# Patient Record
Sex: Male | Born: 1945 | Race: White | Hispanic: No
Health system: Southern US, Community
[De-identification: ages and names within clinical notes are randomized; demographics above are authoritative.]

## PROBLEM LIST (undated history)

## (undated) DIAGNOSIS — M1990 Unspecified osteoarthritis, unspecified site: Secondary | ICD-10-CM

## (undated) DIAGNOSIS — M545 Low back pain, unspecified: Secondary | ICD-10-CM

## (undated) DIAGNOSIS — F329 Major depressive disorder, single episode, unspecified: Secondary | ICD-10-CM

## (undated) DIAGNOSIS — E785 Hyperlipidemia, unspecified: Secondary | ICD-10-CM

## (undated) DIAGNOSIS — T4145XA Adverse effect of unspecified anesthetic, initial encounter: Secondary | ICD-10-CM

## (undated) DIAGNOSIS — I4891 Unspecified atrial fibrillation: Secondary | ICD-10-CM

## (undated) DIAGNOSIS — J449 Chronic obstructive pulmonary disease, unspecified: Secondary | ICD-10-CM

## (undated) DIAGNOSIS — J189 Pneumonia, unspecified organism: Secondary | ICD-10-CM

## (undated) DIAGNOSIS — F419 Anxiety disorder, unspecified: Secondary | ICD-10-CM

## (undated) DIAGNOSIS — Z8669 Personal history of other diseases of the nervous system and sense organs: Secondary | ICD-10-CM

## (undated) DIAGNOSIS — F0390 Unspecified dementia without behavioral disturbance: Secondary | ICD-10-CM

## (undated) DIAGNOSIS — I10 Essential (primary) hypertension: Secondary | ICD-10-CM

## (undated) DIAGNOSIS — F32A Depression, unspecified: Secondary | ICD-10-CM

## (undated) DIAGNOSIS — T8859XA Other complications of anesthesia, initial encounter: Secondary | ICD-10-CM

## (undated) HISTORY — DX: Personal history of other diseases of the nervous system and sense organs: Z86.69

## (undated) HISTORY — PX: CARDIAC CATHETERIZATION: SHX172

## (undated) HISTORY — DX: Essential (primary) hypertension: I10

## (undated) HISTORY — DX: Unspecified atrial fibrillation: I48.91

## (undated) HISTORY — PX: BRAIN SURGERY: SHX531

## (undated) HISTORY — DX: Low back pain: M54.5

## (undated) HISTORY — DX: Unspecified dementia, unspecified severity, without behavioral disturbance, psychotic disturbance, mood disturbance, and anxiety: F03.90

## (undated) HISTORY — PX: JOINT REPLACEMENT: SHX530

## (undated) HISTORY — DX: Unspecified osteoarthritis, unspecified site: M19.90

## (undated) HISTORY — DX: Depression, unspecified: F32.A

## (undated) HISTORY — DX: Anxiety disorder, unspecified: F41.9

## (undated) HISTORY — DX: Low back pain, unspecified: M54.50

## (undated) HISTORY — DX: Hyperlipidemia, unspecified: E78.5

## (undated) HISTORY — PX: NASAL RECONSTRUCTION: SHX2069

## (undated) HISTORY — PX: KIDNEY STONE SURGERY: SHX686

## (undated) HISTORY — DX: Major depressive disorder, single episode, unspecified: F32.9

## (undated) HISTORY — PX: ARM WOUND REPAIR / CLOSURE: SUR1141

## (undated) HISTORY — PX: LEG WOUND REPAIR / CLOSURE: SUR1143

---
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
## 1976-04-01 HISTORY — PX: EYE SURGERY: SHX253

## 2000-04-01 HISTORY — PX: COLONOSCOPY: SHX174

## 2000-04-01 HISTORY — PX: OTHER SURGICAL HISTORY: SHX169

## 2000-05-05 ENCOUNTER — Inpatient Hospital Stay (HOSPITAL_COMMUNITY)
Admission: RE | Admit: 2000-05-05 | Discharge: 2000-05-26 | Payer: Self-pay | Admitting: Physical Medicine & Rehabilitation

## 2000-05-06 ENCOUNTER — Encounter: Payer: Self-pay | Admitting: Physical Medicine & Rehabilitation

## 2000-07-08 ENCOUNTER — Encounter
Admission: RE | Admit: 2000-07-08 | Discharge: 2000-08-29 | Payer: Self-pay | Admitting: Physical Medicine & Rehabilitation

## 2000-10-16 ENCOUNTER — Encounter: Admission: RE | Admit: 2000-10-16 | Discharge: 2000-11-27 | Payer: Self-pay

## 2000-10-25 ENCOUNTER — Emergency Department (HOSPITAL_COMMUNITY): Admission: EM | Admit: 2000-10-25 | Discharge: 2000-10-25 | Payer: Self-pay | Admitting: Emergency Medicine

## 2001-02-01 ENCOUNTER — Encounter: Payer: Self-pay | Admitting: *Deleted

## 2001-02-01 ENCOUNTER — Emergency Department (HOSPITAL_COMMUNITY): Admission: EM | Admit: 2001-02-01 | Discharge: 2001-02-01 | Payer: Self-pay | Admitting: *Deleted

## 2001-07-29 ENCOUNTER — Emergency Department (HOSPITAL_COMMUNITY): Admission: EM | Admit: 2001-07-29 | Discharge: 2001-07-29 | Payer: Self-pay | Admitting: *Deleted

## 2001-07-29 ENCOUNTER — Encounter: Payer: Self-pay | Admitting: *Deleted

## 2003-02-15 ENCOUNTER — Emergency Department (HOSPITAL_COMMUNITY): Admission: EM | Admit: 2003-02-15 | Discharge: 2003-02-15 | Payer: Self-pay | Admitting: Emergency Medicine

## 2006-08-03 ENCOUNTER — Emergency Department (HOSPITAL_COMMUNITY): Admission: EM | Admit: 2006-08-03 | Discharge: 2006-08-03 | Payer: Self-pay | Admitting: Emergency Medicine

## 2007-06-30 ENCOUNTER — Encounter: Payer: Self-pay | Admitting: Physician Assistant

## 2007-08-23 ENCOUNTER — Emergency Department (HOSPITAL_COMMUNITY): Admission: EM | Admit: 2007-08-23 | Discharge: 2007-08-23 | Payer: Self-pay | Admitting: Emergency Medicine

## 2007-08-31 ENCOUNTER — Encounter: Payer: Self-pay | Admitting: Physician Assistant

## 2007-09-09 ENCOUNTER — Encounter (INDEPENDENT_AMBULATORY_CARE_PROVIDER_SITE_OTHER): Payer: Self-pay | Admitting: Family Medicine

## 2007-10-05 ENCOUNTER — Ambulatory Visit: Payer: Self-pay | Admitting: Family Medicine

## 2007-10-05 DIAGNOSIS — Z87898 Personal history of other specified conditions: Secondary | ICD-10-CM

## 2007-10-05 DIAGNOSIS — F329 Major depressive disorder, single episode, unspecified: Secondary | ICD-10-CM | POA: Insufficient documentation

## 2007-10-05 DIAGNOSIS — M545 Low back pain: Secondary | ICD-10-CM | POA: Insufficient documentation

## 2007-10-05 DIAGNOSIS — F3289 Other specified depressive episodes: Secondary | ICD-10-CM | POA: Insufficient documentation

## 2007-10-05 DIAGNOSIS — J309 Allergic rhinitis, unspecified: Secondary | ICD-10-CM | POA: Insufficient documentation

## 2007-10-05 DIAGNOSIS — M129 Arthropathy, unspecified: Secondary | ICD-10-CM | POA: Insufficient documentation

## 2007-10-05 DIAGNOSIS — Z8679 Personal history of other diseases of the circulatory system: Secondary | ICD-10-CM

## 2007-10-05 DIAGNOSIS — F17200 Nicotine dependence, unspecified, uncomplicated: Secondary | ICD-10-CM

## 2007-10-05 DIAGNOSIS — F411 Generalized anxiety disorder: Secondary | ICD-10-CM | POA: Insufficient documentation

## 2007-10-06 ENCOUNTER — Encounter (INDEPENDENT_AMBULATORY_CARE_PROVIDER_SITE_OTHER): Payer: Self-pay | Admitting: Family Medicine

## 2007-10-06 LAB — CONVERTED CEMR LAB
AST: 12 units/L (ref 0–37)
Alkaline Phosphatase: 115 units/L (ref 39–117)
BUN: 13 mg/dL (ref 6–23)
Basophils Absolute: 0.1 10*3/uL (ref 0.0–0.1)
Calcium: 9.3 mg/dL (ref 8.4–10.5)
Chloride: 108 meq/L (ref 96–112)
Creatinine, Ser: 0.76 mg/dL (ref 0.40–1.50)
Glucose, Bld: 76 mg/dL (ref 70–99)
Hemoglobin: 16.5 g/dL (ref 13.0–17.0)
Monocytes Absolute: 0.7 10*3/uL (ref 0.1–1.0)
Neutro Abs: 4 10*3/uL (ref 1.7–7.7)
Neutrophils Relative %: 59 % (ref 43–77)
Potassium: 4.3 meq/L (ref 3.5–5.3)
RBC: 5.53 M/uL (ref 4.22–5.81)
RDW: 13.2 % (ref 11.5–15.5)
Sodium: 141 meq/L (ref 135–145)
TSH: 1.17 microintl units/mL (ref 0.350–4.50)
WBC: 6.8 10*3/uL (ref 4.0–10.5)

## 2007-10-15 ENCOUNTER — Encounter (INDEPENDENT_AMBULATORY_CARE_PROVIDER_SITE_OTHER): Payer: Self-pay | Admitting: Family Medicine

## 2007-10-19 ENCOUNTER — Ambulatory Visit: Payer: Self-pay | Admitting: Family Medicine

## 2007-10-19 DIAGNOSIS — IMO0002 Reserved for concepts with insufficient information to code with codable children: Secondary | ICD-10-CM | POA: Insufficient documentation

## 2007-10-26 ENCOUNTER — Other Ambulatory Visit: Payer: Self-pay | Admitting: Emergency Medicine

## 2007-10-26 ENCOUNTER — Telehealth (INDEPENDENT_AMBULATORY_CARE_PROVIDER_SITE_OTHER): Payer: Self-pay | Admitting: Family Medicine

## 2007-10-27 ENCOUNTER — Ambulatory Visit: Payer: Self-pay | Admitting: Psychiatry

## 2007-10-27 ENCOUNTER — Inpatient Hospital Stay (HOSPITAL_COMMUNITY): Admission: AD | Admit: 2007-10-27 | Discharge: 2007-10-31 | Payer: Self-pay | Admitting: Psychiatry

## 2007-11-03 ENCOUNTER — Emergency Department (HOSPITAL_COMMUNITY): Admission: EM | Admit: 2007-11-03 | Discharge: 2007-11-03 | Payer: Self-pay | Admitting: Emergency Medicine

## 2007-11-19 ENCOUNTER — Ambulatory Visit: Payer: Self-pay | Admitting: Family Medicine

## 2007-11-24 ENCOUNTER — Encounter (INDEPENDENT_AMBULATORY_CARE_PROVIDER_SITE_OTHER): Payer: Self-pay | Admitting: Family Medicine

## 2007-12-31 ENCOUNTER — Ambulatory Visit: Payer: Self-pay | Admitting: Family Medicine

## 2007-12-31 ENCOUNTER — Ambulatory Visit (HOSPITAL_COMMUNITY): Admission: RE | Admit: 2007-12-31 | Discharge: 2007-12-31 | Payer: Self-pay | Admitting: Family Medicine

## 2007-12-31 DIAGNOSIS — S93409A Sprain of unspecified ligament of unspecified ankle, initial encounter: Secondary | ICD-10-CM | POA: Insufficient documentation

## 2008-01-04 ENCOUNTER — Ambulatory Visit: Payer: Self-pay | Admitting: Family Medicine

## 2008-01-18 ENCOUNTER — Ambulatory Visit: Payer: Self-pay | Admitting: Family Medicine

## 2008-02-09 ENCOUNTER — Ambulatory Visit (HOSPITAL_COMMUNITY): Payer: Self-pay | Admitting: Psychiatry

## 2008-02-12 ENCOUNTER — Ambulatory Visit (HOSPITAL_COMMUNITY): Payer: Self-pay | Admitting: Psychology

## 2008-02-15 ENCOUNTER — Ambulatory Visit: Payer: Self-pay | Admitting: Family Medicine

## 2008-02-15 DIAGNOSIS — R609 Edema, unspecified: Secondary | ICD-10-CM

## 2008-02-15 LAB — CONVERTED CEMR LAB
Cholesterol, target level: 200 mg/dL
HDL goal, serum: 40 mg/dL

## 2008-02-24 ENCOUNTER — Ambulatory Visit (HOSPITAL_COMMUNITY): Payer: Self-pay | Admitting: Psychology

## 2008-10-24 ENCOUNTER — Emergency Department (HOSPITAL_COMMUNITY): Admission: EM | Admit: 2008-10-24 | Discharge: 2008-10-24 | Payer: Self-pay | Admitting: Emergency Medicine

## 2008-10-24 ENCOUNTER — Ambulatory Visit (HOSPITAL_COMMUNITY): Admission: RE | Admit: 2008-10-24 | Discharge: 2008-10-24 | Payer: Self-pay | Admitting: Emergency Medicine

## 2009-09-14 ENCOUNTER — Emergency Department (HOSPITAL_COMMUNITY): Admission: EM | Admit: 2009-09-14 | Discharge: 2009-09-14 | Payer: Self-pay | Admitting: Emergency Medicine

## 2009-09-15 ENCOUNTER — Ambulatory Visit: Payer: Self-pay | Admitting: Family Medicine

## 2009-09-15 ENCOUNTER — Encounter: Payer: Self-pay | Admitting: Physician Assistant

## 2009-09-15 DIAGNOSIS — I10 Essential (primary) hypertension: Secondary | ICD-10-CM | POA: Insufficient documentation

## 2009-09-15 DIAGNOSIS — G8921 Chronic pain due to trauma: Secondary | ICD-10-CM | POA: Insufficient documentation

## 2009-09-15 DIAGNOSIS — E559 Vitamin D deficiency, unspecified: Secondary | ICD-10-CM | POA: Insufficient documentation

## 2009-09-18 ENCOUNTER — Telehealth: Payer: Self-pay | Admitting: Physician Assistant

## 2009-09-19 LAB — CONVERTED CEMR LAB
ALT: 26 units/L (ref 0–53)
AST: 21 units/L (ref 0–37)
Albumin: 4.3 g/dL (ref 3.5–5.2)
Alkaline Phosphatase: 74 units/L (ref 39–117)
BUN: 12 mg/dL (ref 6–23)
CO2: 21 meq/L (ref 19–32)
Calcium: 9.2 mg/dL (ref 8.4–10.5)
Chloride: 106 meq/L (ref 96–112)
Cholesterol: 229 mg/dL — ABNORMAL HIGH (ref 0–200)
Creatinine, Ser: 0.83 mg/dL (ref 0.40–1.50)
Glucose, Bld: 91 mg/dL (ref 70–99)
HCT: 49.4 % (ref 39.0–52.0)
HDL: 80 mg/dL (ref >39)
Hemoglobin: 16.9 g/dL (ref 13.0–17.0)
LDL Cholesterol: 132 mg/dL — ABNORMAL HIGH (ref 0–99)
MCHC: 34.2 g/dL (ref 30.0–36.0)
MCV: 97.1 fL (ref 78.0–100.0)
PSA: 1.64 ng/mL (ref 0.10–4.00)
Platelets: 252 10*3/uL (ref 150–400)
Potassium: 4 meq/L (ref 3.5–5.3)
RBC: 5.09 M/uL (ref 4.22–5.81)
RDW: 13.4 % (ref 11.5–15.5)
Sodium: 141 meq/L (ref 135–145)
TSH: 1.477 microintl units/mL (ref 0.350–4.500)
Total Bilirubin: 0.7 mg/dL (ref 0.3–1.2)
Total CHOL/HDL Ratio: 2.9
Total Protein: 6.8 g/dL (ref 6.0–8.3)
Triglycerides: 84 mg/dL (ref <150)
VLDL: 17 mg/dL (ref 0–40)
Vit D, 25-Hydroxy: 14 ng/mL — ABNORMAL LOW (ref 30–89)
WBC: 6 10*3/uL (ref 4.0–10.5)

## 2009-12-27 ENCOUNTER — Ambulatory Visit: Payer: Self-pay | Admitting: Family Medicine

## 2009-12-27 DIAGNOSIS — E785 Hyperlipidemia, unspecified: Secondary | ICD-10-CM | POA: Insufficient documentation

## 2010-01-03 LAB — CONVERTED CEMR LAB
ALT: 29 units/L (ref 0–53)
Albumin: 4.5 g/dL (ref 3.5–5.2)
Calcium: 9.5 mg/dL (ref 8.4–10.5)
Chloride: 104 meq/L (ref 96–112)
Cholesterol: 246 mg/dL — ABNORMAL HIGH (ref 0–200)
Creatinine, Ser: 0.92 mg/dL (ref 0.40–1.50)
HDL: 77 mg/dL (ref >39)
Potassium: 4.9 meq/L (ref 3.5–5.3)
Sodium: 140 meq/L (ref 135–145)
Total CHOL/HDL Ratio: 3.2
Triglycerides: 67 mg/dL (ref <150)
VLDL: 13 mg/dL (ref 0–40)

## 2010-01-10 ENCOUNTER — Ambulatory Visit: Payer: Self-pay | Admitting: Family Medicine

## 2010-02-13 ENCOUNTER — Ambulatory Visit: Payer: Self-pay | Admitting: Family Medicine

## 2010-02-13 DIAGNOSIS — J029 Acute pharyngitis, unspecified: Secondary | ICD-10-CM | POA: Insufficient documentation

## 2010-02-13 DIAGNOSIS — J209 Acute bronchitis, unspecified: Secondary | ICD-10-CM | POA: Insufficient documentation

## 2010-02-13 DIAGNOSIS — J0100 Acute maxillary sinusitis, unspecified: Secondary | ICD-10-CM

## 2010-03-18 ENCOUNTER — Emergency Department (HOSPITAL_COMMUNITY)
Admission: EM | Admit: 2010-03-18 | Discharge: 2010-03-18 | Payer: Self-pay | Source: Home / Self Care | Admitting: Emergency Medicine

## 2010-03-19 ENCOUNTER — Inpatient Hospital Stay (HOSPITAL_COMMUNITY): Admission: EM | Admit: 2010-03-19 | Discharge: 2010-03-21 | Payer: Self-pay | Source: Home / Self Care

## 2010-03-20 ENCOUNTER — Encounter: Payer: Self-pay | Admitting: Orthopedic Surgery

## 2010-03-22 ENCOUNTER — Telehealth: Payer: Self-pay | Admitting: Family Medicine

## 2010-03-26 ENCOUNTER — Encounter: Payer: Self-pay | Admitting: Family Medicine

## 2010-03-29 ENCOUNTER — Telehealth: Payer: Self-pay | Admitting: Family Medicine

## 2010-03-29 ENCOUNTER — Ambulatory Visit: Payer: Self-pay | Admitting: Family Medicine

## 2010-03-30 DIAGNOSIS — L0390 Cellulitis, unspecified: Secondary | ICD-10-CM

## 2010-03-30 DIAGNOSIS — L0291 Cutaneous abscess, unspecified: Secondary | ICD-10-CM

## 2010-04-03 ENCOUNTER — Encounter: Payer: Self-pay | Admitting: Family Medicine

## 2010-04-12 ENCOUNTER — Ambulatory Visit: Admit: 2010-04-12 | Payer: Self-pay | Admitting: Family Medicine

## 2010-04-16 ENCOUNTER — Encounter: Payer: Self-pay | Admitting: Family Medicine

## 2010-04-20 ENCOUNTER — Ambulatory Visit
Admission: RE | Admit: 2010-04-20 | Discharge: 2010-04-20 | Payer: Self-pay | Source: Home / Self Care | Attending: Family Medicine | Admitting: Family Medicine

## 2010-04-20 ENCOUNTER — Encounter: Payer: Self-pay | Admitting: Family Medicine
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
## 2010-05-01 NOTE — Progress Notes (Signed)
[facility]   Imported By: Lind Guest 09/20/2009 11:44:35  _____________________________________________________________________  External Attachment:    Type:   Image     Comment:   External Document

## 2010-05-01 NOTE — Assessment & Plan Note (Signed)
Summary: not well- room 2   Vital Signs:  Patient profile:   65 year old male Height:      68 inches Weight:      164 pounds BMI:     25.03 O2 Sat:      96 % on Room air Pulse rate:   90 / minute Resp:     16 per minute BP sitting:   160 / 80  (left arm)  Vitals Entered By: Everitt Amber LPN (December 27, 2009 9:30 AM)  Serial Vital Signs/Assessments:  Time      Position  BP       Pulse  Resp  Temp     By                     162/80                         Esperanza Sheets PA  CC: not feeling well Is Patient Diabetic? No Pain Assessment Patient in pain? no      Comments did not bring meds to ov   Primary Daylene Vandenbosch:  Franchot Heidelberg, MD  CC:  not feeling well.  History of Present Illness: Hx of TBI.  Has had problems with with depression intermittently.  Tried Lexapro in the past but made him irritable.  Also used Ritalin for awhile to improve thought processes.  "I have bad days."  Pt states that he has been feeling sad for approx 1 - 1 1/2 weeks now.  The rainy cold weather seems to be affecting his mood and also causing increased body aches.  He denies SI or HI.    Hx of htn. Taking medications as prescribed and denies side effects.  No headache, chest pain or palpitations.   Allergies (verified): 1)  ! * Sulfa  Past History:  Past medical history reviewed for relevance to current acute and chronic problems.  Past Medical History: Reviewed history from 09/15/2009 and no changes required. 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 Resp:  Denies cough and shortness of breath. GI:  Denies abdominal pain, change in bowel habits, loss of appetite, nausea, and  vomiting. Psych:  Complains of depression and easily tearful; denies anxiety, easily angered, sense of great danger, suicidal thoughts/plans, thoughts of violence, and thoughts /plans of harming others.  Physical Exam  General:  Well-developed,well-nourished,in no acute distress; alert,appropriate and cooperative throughout examination Head:  Normocephalic and atraumatic without obvious abnormalities. No apparent alopecia or balding. Ears:  External ear exam shows no significant lesions or deformities.  Otoscopic examination reveals clear canals, tympanic membranes are intact bilaterally without bulging, retraction, inflammation or discharge. Hearing is grossly normal bilaterally. Nose:  External nasal examination shows no deformity or inflammation. Nasal mucosa are pink and moist without lesions or exudates. Mouth:  Oral mucosa and oropharynx without lesions or exudates. Neck:  No deformities, masses, or tenderness noted. Lungs:  Normal respiratory effort, chest expands symmetrically. Lungs are clear to auscultation, no crackles or wheezes. Heart:  Normal rate and regular rhythm. S1 and S2 normal without gallop, murmur, click, rub or other extra sounds. Cervical Nodes:  No lymphadenopathy noted Psych:  Cognition and judgment appear intact. Alert and cooperative  with normal attention span and concentration. No apparent delusions, illusions, hallucinations   Impression & Recommendations:  Problem # 1:  ESSENTIAL HYPERTENSION (ICD-401.9)  His updated medication list for this problem includes:    Lisinopril 20 Mg Tabs (Lisinopril) ..... One tab by mouth once daily    Furosemide 20 Mg Tabs (Furosemide) .Marland Kitchen... Take 1/2 to 1 tablet in the morning as needed for swelling  Orders: T-Comprehensive Metabolic Panel ([id number])  BP today: 160/80 Prior BP: 140/80 (09/15/2009)  Prior 10 Yr Risk Heart Disease: Not enough information (02/15/2008)  Labs Reviewed: K+: 4.0 (09/15/2009) Creat: : 0.83  (09/15/2009)   Chol: 229 (09/15/2009)   HDL: 80 (09/15/2009)   LDL: 132 (09/15/2009)   TG: 84 (09/15/2009)  Problem # 2:  DEPRESSION (ICD-311) Assessment: Deteriorated Pt states "I feel better just talking to someone."  Declines referral to psychologist or prescription antidepressant.  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 One tab by mouth once daily 2)  Ibuprofen 800 Mg Tabs (Ibuprofen) .... One tab by mouth three times a day as needed 3)  Tramadol Hcl 50 Mg Tabs (Tramadol hcl) .... One tab as needed 4)  Furosemide 20 Mg Tabs (Furosemide) .... Take 1/2 to 1 tablet in the morning as needed for swelling 5)  Vitamin D (ergocalciferol) 50000 Unit Caps (Ergocalciferol) .... One cap by mouth every week  Other Orders: T-Lipid Profile ([id number]) Influenza Vaccine NON MCR ([id number])  Patient Instructions: 1)  Please schedule a follow-up appointment in 2 weeks. 2)  I have ordered blood work. You may have this drawn today. 3)  If your mood does not improve you may need to return to a psychologist. 4)  Tobacco is very bad for your health and your loved ones! You Should stop smoking!. 5)  Stop Smoking Tips: Choose a Quit date. Cut down before the Quit date. decide what you will do as a substitute when you feel the urge to smoke(gum,toothpick,exercise). Prescriptions: LISINOPRIL 20 MG TABS (LISINOPRIL) one tab by mouth once daily  #30 x 3   Entered and Authorized by:   Esperanza Sheets PA   Signed by:   Esperanza Sheets PA on 12/27/2009   Method used:   Electronically to        [facility]* (retail)       [address]       [place], [place]  [address]       Ph: [phone] or [phone]       Fax: [phone]   RxID:   [id number]    Influenza Vaccine    Vaccine Type: Fluvax Non-MCR    Site: right deltoid    Mfr: novartis    Dose: 0.5 ml     Route: IM    Given by: Adella Hare LPN    Exp. Date: 07/2010    Lot #: [id number]    VIS given: 10/24/09 version given December 27, 2009.

## 2010-05-01 NOTE — Progress Notes (Signed)
[facility]   Imported By: Lind Guest 09/20/2009 12:51:50  _____________________________________________________________________  External Attachment:    Type:   Image     Comment:   External Document

## 2010-05-01 NOTE — Assessment & Plan Note (Signed)
Summary: F UP   Vital Signs:  Patient profile:   65 year old male Height:      68 inches Weight:      161.50 pounds BMI:     24.64 O2 Sat:      95 % Pulse rate:   83 / minute Resp:     16 per minute BP sitting:   114 / 74  (left arm)  Vitals Entered By: Everitt Amber LPN  CC: Follow up chronic problems   Primary Jalal Rauch:  Franchot Heidelberg, MD  CC:  Follow up chronic problems.  History of Present Illness: Pt presents today for check up. Pt states he is emotionally feeling better for the last 1 week.  sleep is good.  Discussed recent labs. Pt picking up Pravastatin prescription today.  Aware of low fat diet,and no questions.  Hx of htn. Taking medications as prescribed and denies side effects.  No headache, chest pain or palpitations.     Current Medications (verified): 1)  Lisinopril 20 Mg Tabs (Lisinopril) .... One Tab By Mouth Once Daily 2)  Ibuprofen 800 Mg Tabs (Ibuprofen) .... One Tab By Mouth Three Times A Day As Needed 3)  Tramadol Hcl 50 Mg Tabs (Tramadol Hcl) .... One Tab As Needed 4)  Furosemide 20 Mg Tabs (Furosemide) .... Take 1/2 To 1 Tablet in The Morning As Needed For Swelling 5)  Vitamin D (Ergocalciferol) 50000 Unit Caps (Ergocalciferol) .... One Cap By Mouth Every Week 6)  Pravastatin Sodium 40 Mg Tabs (Pravastatin Sodium) .... Take 1 Tab By Mouth At Bedtime  Allergies (verified): 1)  ! * Sulfa  Past History:  Past medical history reviewed for relevance to current acute and chronic problems.  Past Medical History: Reviewed history from 09/15/2009 and no changes required. 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 ENT:  Denies earache, nasal congestion,  and sore throat. CV:  Denies chest pain or discomfort and palpitations. Resp:  Denies cough and shortness of breath. Psych:  Denies anxiety, depression, and easily tearful.  Physical Exam  General:  Well-developed,well-nourished,in no acute distress; alert,appropriate and cooperative throughout examination Head:  Normocephalic and atraumatic without obvious abnormalities. No apparent alopecia or balding. Ears:  External ear exam shows no significant lesions or deformities.  Otoscopic examination reveals clear canals, tympanic membranes are intact bilaterally without bulging, retraction, inflammation or discharge. Hearing is grossly normal bilaterally. Nose:  External nasal examination shows no deformity or inflammation. Nasal mucosa are pink and moist without lesions or exudates. Mouth:  Oral mucosa and oropharynx without lesions or exudates.  teeth missing.   Neck:  No deformities, masses, or tenderness noted. Lungs:  Normal respiratory effort, chest expands symmetrically. Lungs are clear to auscultation, no crackles or wheezes. Heart:  Normal rate and regular rhythm. S1 and S2 normal without gallop, murmur, click, rub or other extra sounds. Cervical Nodes:  No lymphadenopathy noted Psych:  Cognition and judgment appear intact. Alert and cooperative with normal attention span and concentration. No apparent delusions, illusions, hallucinations   Impression & Recommendations:  Problem # 1:  ESSENTIAL HYPERTENSION (ICD-401.9) Assessment Improved  His updated medication list for this problem includes:    Lisinopril 20 Mg Tabs (Lisinopril) ..... One tab by mouth once daily    Furosemide 20  Mg Tabs (Furosemide) .Marland Kitchen... Take 1/2 to 1 tablet in the morning as needed for swelling  BP today: 114/74 Prior BP: 160/80 (12/27/2009)  Prior 10 Yr Risk Heart Disease: Not enough information (02/15/2008)  Labs Reviewed: K+: 4.9 (12/27/2009) Creat: : 0.92 (12/27/2009)   Chol: 246 (12/27/2009)   HDL: 77  (12/27/2009)   LDL: 156 (12/27/2009)   TG: 67 (12/27/2009)  Problem # 2:  HYPERLIPIDEMIA (ICD-272.4) Assessment: New  His updated medication list for this problem includes:    Pravastatin Sodium 40 Mg Tabs (Pravastatin sodium) .Marland Kitchen... Take 1 tab by mouth at bedtime  Problem # 3:  DEPRESSION (ICD-311) Assessment: Improved  Complete Medication List: 1)  Lisinopril 20 Mg Tabs (Lisinopril) .... One tab by mouth once daily 2)  Ibuprofen 800 Mg Tabs (Ibuprofen) .... One tab by mouth three times a day as needed 3)  Tramadol Hcl 50 Mg Tabs (Tramadol hcl) .... One tab as needed 4)  Furosemide 20 Mg Tabs (Furosemide) .... Take 1/2 to 1 tablet in the morning as needed for swelling 5)  Vitamin D (ergocalciferol) 50000 Unit Caps (Ergocalciferol) .... One cap by mouth every week 6)  Pravastatin Sodium 40 Mg Tabs (Pravastatin sodium) .... Take 1 tab by mouth at bedtime  Patient Instructions: 1)  Please schedule a follow-up appointment in 2 months. 2)  I am glad you are feeling better.  Return sooner if needed. 3)  Continue your current medications and start the Pravastatin as discussed. 4)  Follow a low fat diet for you cholesterol. 5)  Tobacco is very bad for your health and your loved ones! You Should stop smoking!. 6)  Stop Smoking Tips: Choose a Quit date. Cut down before the Quit date. decide what you will do as a substitute when you feel the urge to smoke(gum,toothpick,exercise).
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
## 2010-05-01 NOTE — Progress Notes (Signed)
Summary: med  Phone Note Call from Patient   Summary of Call: write rx for script for lasix. and he is allergic to sulf. needs another med called in [phone] Initial call taken by: Rudene Anda,  September 18, 2009 8:47 AM  Follow-up for Phone Call        patient states insert with furosemide states if allergic to sulfa not to take and he is Follow-up by: Adella Hare LPN,  September 18, 2009 1:46 PM  Additional Follow-up for Phone Call Additional follow up Details #1::        Discussed with pt that this is not a contraindication and is a precaution with most of the diuretics.  The likely-hood of rxn is low, though he needs to use it with caution. If he develops a rash,etc then will not be able to take it any longer.  If he develops a severe rxn, just as with any allergy, he should do immediately to the ER. Additional Follow-up by: Esperanza Sheets PA,  September 18, 2009 2:41 PM

## 2010-05-03 NOTE — Assessment & Plan Note (Signed)
Summary: hfu/slj   Vital Signs:  Patient profile:   65 year old male Height:      68 inches Weight:      163.50 pounds BMI:     24.95 O2 Sat:      97 % on Room air Pulse rate:   76 / minute Resp:     16 per minute BP sitting:   106 / 72  (left arm)  Vitals Entered By: Adella Hare LPN (March 29, 2010 10:09 AM)  O2 Flow:  Room air CC: follow-up visit Is Patient Diabetic? No   Primary Care Provider:  Franchot Heidelberg, MD  CC:  follow-up visit.  History of Present Illness: Pt in for f/u of cellulitis of left hand requiring recent hospitalisation. He denes any current redness, pain or swelling, butby his account , his condition rapidly deteriorated after cutting himself with aparing knife several weeks ago. he did not quite complete his iV vanc course for financial reasons and I switched him to oral meds. there have been no recent fever , chills or drainage from the area and he has full use of his hand. Reports  that he has otherwise been doing well.  Denies sinus pressure, nasal congestion , ear pain or sore throat. Denies chest congestion, or cough productive of sputum. Denies chest pain, palpitations, PND, orthopnea or leg swelling. Denies abdominal pain, nausea, vomitting, diarrhea or constipation. Denies change in bowel movements or bloody stool. Denies dysuria , frequency, incontinence or hesitancy. Denies  joint pain, swelling, or reduced mobility. Denies headaches, vertigo, seizures. Denies depression, anxiety or insomnia. still smokes a pipe      Current Medications (verified): 1)  Lisinopril 20 Mg Tabs (Lisinopril) .... One Tab By Mouth Once Daily 2)  Ibuprofen 800 Mg Tabs (Ibuprofen) .... One Tab By Mouth Three Times A Day As Needed 3)  Furosemide 20 Mg Tabs (Furosemide) .... Take 1/2 To 1 Tablet in The Morning As Needed For Swelling 4)  Vitamin D (Ergocalciferol) 50000 Unit Caps (Ergocalciferol) .... One Cap By Mouth Every Week 5)  Pravastatin Sodium 40 Mg  Tabs (Pravastatin Sodium) .... Take 1 Tab By Mouth At Bedtime  Allergies (verified): 1)  ! * Sulfa  Review of Systems      See HPI Eyes:  Denies discharge and red eye. Endo:  Denies cold intolerance, excessive hunger, excessive thirst, and excessive urination. Heme:  Denies abnormal bruising and bleeding. Allergy:  Denies hives or rash and itching eyes.  Physical Exam  General:  Well-developed,well-nourished,in no acute distress; alert,appropriate and cooperative throughout examination HEENT: No facial asymmetry,  EOMI, No sinus tenderness, TM's Clear, oropharynx  pink and moist.   Chest: decreased air entry bilaterally CVS: S1, S2, No murmurs, No S3.   Abd: Soft, Nontender.  MS: Adequate ROM spine, hips, shoulders and knees.  Ext: No edema.   CNS: CN 2-12 intact, power tone and sensation normal throughout.   Skin: Intact, no visible lesions or rashes.  Psych: Good eye contact, normal affect.  Memory intact, not anxious or depressed appearing.     Impression & Recommendations:  Problem # 1:  ESSENTIAL HYPERTENSION (ICD-401.9) Assessment Unchanged  His updated medication list for this problem includes:    Lisinopril 20 Mg Tabs (Lisinopril) ..... One tab by mouth once daily    Furosemide 20 Mg Tabs (Furosemide) .Marland Kitchen... Take 1/2 to 1 tablet in the morning as needed for swelling  BP today: 106/72 Prior BP: 130/80 (02/13/2010)  Prior 10 Yr Risk Heart  Disease: Not enough information (02/15/2008)  Labs Reviewed: K+: 4.9 (12/27/2009) Creat: : 0.92 (12/27/2009)   Chol: 246 (12/27/2009)   HDL: 77 (12/27/2009)   LDL: 156 (12/27/2009)   TG: 67 (12/27/2009)  Problem # 2:  CELLULITIS AND ABSCESS OF UNSPECIFIED SITE (ICD-682.9) Assessment: Improved  The following medications were removed from the medication list:    Penicillin V Potassium 500 Mg Tabs (Penicillin v potassium) .Marland Kitchen... Take 1 tablet by mouth three times a day    Clindamycin Hcl 300 Mg Caps (Clindamycin hcl) .Marland Kitchen... Take 1  capsule by mouth three times a day  Problem # 3:  HYPERLIPIDEMIA (ICD-272.4) Assessment: Comment Only  His updated medication list for this problem includes:    Pravastatin Sodium 40 Mg Tabs (Pravastatin sodium) .Marland Kitchen... Take 1 tab by mouth at bedtime  Labs Reviewed: SGOT: 32 (12/27/2009)   SGPT: 29 (12/27/2009) Low fat dietdiscussed and encouraged  Lipid Goals: Chol Goal: 200 (02/15/2008)   HDL Goal: 40 (02/15/2008)   LDL Goal: 130 (02/15/2008)   TG Goal: 150 (02/15/2008)  Prior 10 Yr Risk Heart Disease: Not enough information (02/15/2008)   HDL:77 (12/27/2009), 80 (09/15/2009)  LDL:156 (12/27/2009), 132 (09/15/2009)  Chol:246 (12/27/2009), 229 (09/15/2009)  Trig:67 (12/27/2009), 84 (09/15/2009)  Problem # 4:  TOBACCO ABUSE (ICD-305.1) Assessment: Unchanged  Encouraged smoking cessation and discussed different methods for smoking cessation.   Complete Medication List: 1)  Lisinopril 20 Mg Tabs (Lisinopril) .... One tab by mouth once daily 2)  Ibuprofen 800 Mg Tabs (Ibuprofen) .... One tab by mouth three times a day as needed 3)  Furosemide 20 Mg Tabs (Furosemide) .... Take 1/2 to 1 tablet in the morning as needed for swelling 4)  Vitamin D (ergocalciferol) 50000 Unit Caps (Ergocalciferol) .... One cap by mouth every week 5)  Pravastatin Sodium 40 Mg Tabs (Pravastatin sodium) .... Take 1 tab by mouth at bedtime  Patient Instructions: 1)  f/u as before. 2)  labs as befoe 3)  your hand is totally  better.   Orders Added: 1)  Est. Patient Level III [[address]]

## 2010-05-03 NOTE — Miscellaneous (Signed)
Summary: Home Care Report  Home Care Report   Imported By: Lind Guest 04/16/2010 15:02:04  _____________________________________________________________________  External Attachment:    Type:   Image     Comment:   External Document

## 2010-05-03 NOTE — Progress Notes (Signed)
Summary: MEDICATION  Phone Note Call from Patient   Summary of Call: [facility] CALLED STATES THAT PATIENT IS NOT ABLE FOR FINISH THE 12 DAY COURSE DUE TO HIS CO-PAYMENT ON RX VANCOMYCIN 2 GRAM Initial call taken by: Eugenio Hoes,  March 22, 2010 10:35 AM  Follow-up for Phone Call        I will erx clindamycin 300mg  Take 1 capsule by mouth three times a day for 1 week, if he gets redness, pain or swelling he should go to the Ed, pls let home health and the pt know Follow-up by: [name] MD,  March 22, 2010 2:00 PM  Additional Follow-up for Phone Call Additional follow up Details #1::        called patient, left message  home health aware Additional Follow-up by: Adella Hare LPN,  March 22, 2010 2:08 PM    Additional Follow-up for Phone Call Additional follow up Details #2::    Lucky from [facility] states Mr. Erhart is willing to pay the first week's copay for the IV medication, no need to call in medication.   Follow-up by: Curtis Sites,  March 22, 2010 3:22 PM  New/Updated Medications: CLINDAMYCIN HCL 300 MG CAPS (CLINDAMYCIN HCL) Take 1 capsule by mouth three times a day Prescriptions: CLINDAMYCIN HCL 300 MG CAPS (CLINDAMYCIN HCL) Take 1 capsule by mouth three times a day  #21 x 0   Entered and Authorized by:   [name] MD   Signed by:   [name] MD on 03/22/2010   Method used:   Electronically to        [facility] (retail)       [address]       [place], [place]  [address]       Ph: [phone] or [phone]       Fax: [phone]   RxID:   [id number]  noted and script cancelled at pharmacy

## 2010-05-03 NOTE — Progress Notes (Signed)
  Phone Note Outgoing Call   Call placed by: Adella Hare LPN,  March 29, 2010 11:32 AM Call placed to: LUCKY @ [facility] Summary of Call: ADVISED TO DISCONTINUE IV ANTIBIOTIC AND PICC LINE PER DR SIMPSON'S ORDERS Initial call taken by: Adella Hare LPN,  March 29, 2010 11:32 AM

## 2010-05-03 NOTE — Miscellaneous (Signed)
Summary: Home Care Report  Home Care Report   Imported By: Lind Guest 04/03/2010 15:19:02  _____________________________________________________________________  External Attachment:    Type:   Image     Comment:   External Document

## 2010-05-03 NOTE — Consult Note (Signed)
Summary: Hospital consult [facility] consult [facility]   Imported By: Cammie Sickle 04/05/2010 12:38:08  _____________________________________________________________________  External Attachment:    Type:   Image     Comment:   External Document

## 2010-05-03 NOTE — Letter (Signed)
Summary: certification  certification   Imported By: Lind Guest 04/20/2010 14:06:50  _____________________________________________________________________  External Attachment:    Type:   Image     Comment:   External Document

## 2010-05-09 NOTE — Assessment & Plan Note (Signed)
Summary: certification   Allergies: 1)  ! * Sulfa   Complete Medication List: 1)  Lisinopril 20 Mg Tabs (Lisinopril) .... One tab by mouth once daily 2)  Ibuprofen 800 Mg Tabs (Ibuprofen) .... One tab by mouth three times a day as needed 3)  Furosemide 20 Mg Tabs (Furosemide) .... Take 1/2 to 1 tablet in the morning as needed for swelling 4)  Vitamin D (ergocalciferol) 50000 Unit Caps (Ergocalciferol) .... One cap by mouth every week 5)  Pravastatin Sodium 40 Mg Tabs (Pravastatin sodium) .... Take 1 tab by mouth at bedtime   Orders Added: 1)  New Certification-Home Health [G0180]

## 2010-06-11 LAB — DIFFERENTIAL
Basophils Absolute: 0 10*3/uL (ref 0.0–0.1)
Basophils Relative: 0 % (ref 0–1)
Basophils Relative: 1 % (ref 0–1)
Eosinophils Relative: 1 % (ref 0–5)
Eosinophils Relative: 1 % (ref 0–5)
Lymphocytes Relative: 19 % (ref 12–46)
Lymphocytes Relative: 33 % (ref 12–46)
Lymphs Abs: 2.1 10*3/uL (ref 0.7–4.0)
Monocytes Absolute: 0.9 10*3/uL (ref 0.1–1.0)
Monocytes Absolute: 1 10*3/uL (ref 0.1–1.0)
Monocytes Relative: 11 % (ref 3–12)
Monocytes Relative: 9 % (ref 3–12)
Neutro Abs: 3.4 10*3/uL (ref 1.7–7.7)
Neutro Abs: 6.8 10*3/uL (ref 1.7–7.7)
Neutro Abs: 8 10*3/uL — ABNORMAL HIGH (ref 1.7–7.7)
Neutrophils Relative %: 55 % (ref 43–77)
Neutrophils Relative %: 72 % (ref 43–77)

## 2010-06-11 LAB — BASIC METABOLIC PANEL
BUN: 16 mg/dL (ref 6–23)
BUN: 19 mg/dL (ref 6–23)
CO2: 22 mEq/L (ref 19–32)
Chloride: 105 mEq/L (ref 96–112)
GFR calc Af Amer: >60 mL/min (ref >60)
GFR calc Af Amer: >60 mL/min (ref >60)
GFR calc non Af Amer: >60 mL/min (ref >60)
Glucose, Bld: 102 mg/dL — ABNORMAL HIGH (ref 70–99)
Potassium: 3.9 mEq/L (ref 3.5–5.1)

## 2010-06-11 LAB — CBC
Hemoglobin: 15.9 g/dL (ref 13.0–17.0)
MCH: 31.9 pg (ref 26.0–34.0)
MCH: 32.6 pg (ref 26.0–34.0)
MCHC: 35.6 g/dL (ref 30.0–36.0)
MCV: 90.8 fL (ref 78.0–100.0)
RBC: 4.76 MIL/uL (ref 4.22–5.81)
RBC: 4.84 MIL/uL (ref 4.22–5.81)
RBC: 4.99 MIL/uL (ref 4.22–5.81)
RDW: 13 % (ref 11.5–15.5)
WBC: 9.1 10*3/uL (ref 4.0–10.5)

## 2010-06-11 LAB — SEDIMENTATION RATE: Sed Rate: 25 mm/hr — ABNORMAL HIGH (ref 0–16)

## 2010-06-11 LAB — C-REACTIVE PROTEIN: CRP: 10.2 mg/dL — ABNORMAL HIGH (ref <0.6)

## 2010-06-11 LAB — VANCOMYCIN, TROUGH: Vancomycin Tr: 7.7 ug/mL — ABNORMAL LOW (ref 10.0–20.0)

## 2010-06-25 ENCOUNTER — Other Ambulatory Visit: Payer: Self-pay | Admitting: *Deleted

## 2010-06-25 DIAGNOSIS — I10 Essential (primary) hypertension: Secondary | ICD-10-CM

## 2010-06-25 MED ORDER — LISINOPRIL 20 MG PO TABS: 20.0000 mg | ORAL_TABLET | Freq: Every day | ORAL | Status: DC

## 2010-06-27 ENCOUNTER — Other Ambulatory Visit: Payer: Self-pay

## 2010-06-27 DIAGNOSIS — I10 Essential (primary) hypertension: Secondary | ICD-10-CM

## 2010-06-27 MED ORDER — LISINOPRIL 20 MG PO TABS: 20.0000 mg | ORAL_TABLET | Freq: Every day | ORAL | Status: DC
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
## 2010-09-24 ENCOUNTER — Telehealth: Payer: Self-pay | Admitting: Family Medicine

## 2010-09-24 MED ORDER — LISINOPRIL 20 MG PO TABS: 20.0000 mg | ORAL_TABLET | Freq: Every day | ORAL | Status: DC

## 2010-09-24 NOTE — Telephone Encounter (Signed)
meds sent as reqested

## 2010-09-25 ENCOUNTER — Other Ambulatory Visit: Payer: Self-pay | Admitting: *Deleted

## 2010-09-25 MED ORDER — LISINOPRIL 20 MG PO TABS: 20.0000 mg | ORAL_TABLET | Freq: Every day | ORAL | Status: DC

## 2010-10-08 ENCOUNTER — Other Ambulatory Visit: Payer: Self-pay

## 2010-10-08 MED ORDER — LISINOPRIL 20 MG PO TABS: 20.0000 mg | ORAL_TABLET | Freq: Every day | ORAL | Status: DC

## 2010-11-21 ENCOUNTER — Other Ambulatory Visit: Payer: Self-pay | Admitting: *Deleted

## 2010-11-21 MED ORDER — LISINOPRIL 20 MG PO TABS: 20.0000 mg | ORAL_TABLET | Freq: Every day | ORAL | Status: DC

## 2010-11-27 ENCOUNTER — Encounter: Payer: Self-pay | Admitting: Family Medicine

## 2010-11-28 ENCOUNTER — Ambulatory Visit (INDEPENDENT_AMBULATORY_CARE_PROVIDER_SITE_OTHER): Payer: Medicare Other | Admitting: Family Medicine

## 2010-11-28 ENCOUNTER — Encounter: Payer: Self-pay | Admitting: Family Medicine

## 2010-11-28 VITALS — BP 130/80 | HR 71 | Ht 67.0 in | Wt 157.0 lb

## 2010-11-28 DIAGNOSIS — E785 Hyperlipidemia, unspecified: Secondary | ICD-10-CM

## 2010-11-28 DIAGNOSIS — R5383 Other fatigue: Secondary | ICD-10-CM

## 2010-11-28 DIAGNOSIS — Z125 Encounter for screening for malignant neoplasm of prostate: Secondary | ICD-10-CM

## 2010-11-28 DIAGNOSIS — E559 Vitamin D deficiency, unspecified: Secondary | ICD-10-CM

## 2010-11-28 DIAGNOSIS — I10 Essential (primary) hypertension: Secondary | ICD-10-CM

## 2010-11-28 DIAGNOSIS — F17200 Nicotine dependence, unspecified, uncomplicated: Secondary | ICD-10-CM

## 2010-11-28 DIAGNOSIS — Z1211 Encounter for screening for malignant neoplasm of colon: Secondary | ICD-10-CM

## 2010-11-28 MED ORDER — LISINOPRIL 20 MG PO TABS: 20.0000 mg | ORAL_TABLET | Freq: Every day | ORAL | Status: DC

## 2010-11-28 NOTE — Assessment & Plan Note (Signed)
Improved, now only using a pipe, has stopped cigarettes, no plan to stop the pipe at this time

## 2010-11-28 NOTE — Assessment & Plan Note (Signed)
Low fat diet discussed and encouraged, rept labs before next visit

## 2010-11-28 NOTE — Patient Instructions (Signed)
CPE end October.  You are being referred for a screening colonscopy.  LABWORK  NEEDS TO BE DONE BETWEEN 3 TO 7 DAYS BEFORE YOUR NEXT SCEDULED  VISIT.  THIS WILL IMPROVE THE QUALITY OF YOUR CARE.   Fasting lipid, chem 7, hepatic , tsh and PSA and vit D in October  Blood pressure is great , continue medication

## 2010-11-28 NOTE — Progress Notes (Signed)
  Subjective:    Patient ID: Kent Morrow, male    DOB: 1945/10/30, 65 y.o.   MRN: [id number]  HPI The PT is here for follow up and re-evaluation of chronic medical conditions, medication management and review of any available recent lab and radiology data.  Preventive health is updated, specifically  Cancer screening and Immunization.   Questions or concerns regarding consultations or procedures which the PT has had in the interim are  addressed. The PT denies any adverse reactions to current medications since the last visit.  There are no new concerns.  There are no specific complaints       Review of Systems    Denies recent fever or chills. Denies sinus pressure, nasal congestion, ear pain or sore throat. Denies chest congestion, productive cough or wheezing. Denies chest pains, palpitations and leg swelling Denies abdominal pain, nausea, vomiting,diarrhea or constipation.   Denies dysuria, frequency, hesitancy or incontinence. Denies joint pain, swelling and limitation in mobility. Occasional headaches and some memory loss, he has had head trauma in the past. Denies uncontrolled  depression, anxiety or insomnia. Denies skin break down or rash.     Objective:   Physical Exam Patient alert and oriented and in no cardiopulmonary distress.  HEENT: No facial asymmetry, EOMI, no sinus tenderness,  oropharynx pink and moist.  Neck supple no adenopathy.  Chest: Clear to auscultation bilaterally.  CVS: S1, S2 no murmurs, no S3.  ABD: Soft non tender. Bowel sounds normal.  Ext: No edema  MS: Adequate ROM spine, shoulders, hips and knees.  Skin: Intact, no ulcerations or rash noted.  Psych: Good eye contact, normal affect. Memory mildly impaired not anxious or depressed appearing.  CNS: CN 2-12 intact, power, tone and sensation normal throughout.        Assessment & Plan:

## 2010-11-28 NOTE — Assessment & Plan Note (Signed)
Controlled, no change in medication  

## 2010-12-12 ENCOUNTER — Telehealth: Payer: Self-pay

## 2010-12-12 NOTE — Telephone Encounter (Signed)
Tried to call pt. Home number [phone] is temporarily disconnected. Call would not go through on the mobile number of [phone]. Mailing a letter for pt to call.

## 2010-12-24 ENCOUNTER — Encounter: Payer: Self-pay | Admitting: Gastroenterology

## 2010-12-24 ENCOUNTER — Ambulatory Visit (INDEPENDENT_AMBULATORY_CARE_PROVIDER_SITE_OTHER): Payer: Medicare Other | Admitting: Gastroenterology

## 2010-12-24 ENCOUNTER — Telehealth: Payer: Self-pay | Admitting: Gastroenterology

## 2010-12-24 VITALS — BP 124/77 | HR 70 | Temp 97.8°F | Ht 67.0 in | Wt 155.4 lb

## 2010-12-24 DIAGNOSIS — Z1211 Encounter for screening for malignant neoplasm of colon: Secondary | ICD-10-CM

## 2010-12-24 DIAGNOSIS — Z79899 Other long term (current) drug therapy: Secondary | ICD-10-CM
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
## 2010-12-24 NOTE — Telephone Encounter (Signed)
PT SCHEDULED FOR PRE OP ON 01/10/11 @ 10:30- HE IS AWARE

## 2010-12-24 NOTE — Progress Notes (Signed)
Cc to PCP 
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
## 2010-12-28 LAB — BASIC METABOLIC PANEL
CO2: 25
Calcium: 9.1
Chloride: 106
Chloride: 108
GFR calc Af Amer: >60
GFR calc Af Amer: >60
Potassium: 3.8
Potassium: 3.6
Sodium: 141

## 2010-12-28 LAB — ETHANOL: Alcohol, Ethyl (B): <5

## 2010-12-28 LAB — DIFFERENTIAL
Basophils Relative: 1
Eosinophils Absolute: 0
Eosinophils Relative: 1
Lymphocytes Relative: 32
Lymphs Abs: 2.2
Monocytes Absolute: 0.6
Monocytes Relative: 8
Monocytes Relative: 8
Neutro Abs: 5.2
Neutrophils Relative %: 60

## 2010-12-28 LAB — CBC
HCT: 45.8
HCT: 45
Hemoglobin: 15.2
MCHC: 33.7
MCV: 89.2
MCV: 89.3
RBC: 5.14
RBC: 5.03
WBC: 7

## 2010-12-28 LAB — RAPID URINE DRUG SCREEN, HOSP PERFORMED
Amphetamines: NOT DETECTED
Benzodiazepines: NOT DETECTED
Opiates: NOT DETECTED
Tetrahydrocannabinol: NOT DETECTED

## 2011-01-10 ENCOUNTER — Encounter (HOSPITAL_COMMUNITY): Payer: Self-pay

## 2011-01-10 ENCOUNTER — Encounter (HOSPITAL_COMMUNITY)
Admission: RE | Admit: 2011-01-10 | Discharge: 2011-01-10 | Disposition: A | Discharge status: Home / Self Care | Payer: Medicare Other | Source: Ambulatory Visit | Attending: Internal Medicine | Admitting: Internal Medicine

## 2011-01-10 ENCOUNTER — Other Ambulatory Visit: Payer: Self-pay

## 2011-01-10 DIAGNOSIS — Z01812 Encounter for preprocedural laboratory examination: Secondary | ICD-10-CM | POA: Insufficient documentation

## 2011-01-10 DIAGNOSIS — Z01810 Encounter for preprocedural cardiovascular examination: Secondary | ICD-10-CM | POA: Insufficient documentation

## 2011-01-10 HISTORY — DX: Adverse effect of unspecified anesthetic, initial encounter: T41.45XA

## 2011-01-10 HISTORY — DX: Other complications of anesthesia, initial encounter: T88.59XA

## 2011-01-10 LAB — CBC
HCT: 49.6 % (ref 39.0–52.0)
Hemoglobin: 17.2 g/dL — ABNORMAL HIGH (ref 13.0–17.0)
MCH: 32 pg (ref 26.0–34.0)
MCHC: 34.7 g/dL (ref 30.0–36.0)

## 2011-01-10 LAB — BASIC METABOLIC PANEL
BUN: 18 mg/dL (ref 6–23)
Calcium: 9.7 mg/dL (ref 8.4–10.5)
GFR calc non Af Amer: >90 mL/min (ref >90)
Glucose, Bld: 69 mg/dL — ABNORMAL LOW (ref 70–99)
Sodium: 140 mEq/L (ref 135–145)

## 2011-01-10 NOTE — Patient Instructions (Addendum)
[name]  01/10/2011   Your procedure is scheduled on:  01/17/2011  Report to [facility] at  920  AM.  Call this number if you have problems the morning of surgery: [phone]   Remember:   Do not eat food:After Midnight.  Do not drink clear liquids: After Midnight.  Take these medicines the morning of surgery with A SIP OF WATER: lisinopril   Do not wear jewelry, make-up or nail polish.  Do not wear lotions, powders, or perfumes. You may wear deodorant.  Do not shave 48 hours prior to surgery.  Do not bring valuables to the hospital.  Contacts, dentures or bridgework may not be worn into surgery.  Leave suitcase in the car. After surgery it may be brought to your room.  For patients admitted to the hospital, checkout time is 11:00 AM the day of discharge.   Patients discharged the day of surgery will not be allowed to drive home.  Name and phone number of your driver: family  Special Instructions: N/A   Please read over the following fact sheets that you were given: Pain Booklet, Surgical Site Infection Prevention, Anesthesia Post-op Instructions and Care and Recovery After Surgery PATIENT INSTRUCTIONS POST-ANESTHESIA  IMMEDIATELY FOLLOWING SURGERY:  Do not drive or operate machinery for the first twenty four hours after surgery.  Do not make any important decisions for twenty four hours after surgery or while taking narcotic pain medications or sedatives.  If you develop intractable nausea and vomiting or a severe headache please notify your doctor immediately.  FOLLOW-UP:  Please make an appointment with your surgeon as instructed. You do not need to follow up with anesthesia unless specifically instructed to do so.  WOUND CARE INSTRUCTIONS (if applicable):  Keep a dry clean dressing on the anesthesia/puncture wound site if there is drainage.  Once the wound has quit draining you may leave it open to air.  Generally you should leave the bandage intact for twenty four  hours unless there is drainage.  If the epidural site drains for more than 36-48 hours please call the anesthesia department.  QUESTIONS?:  Please feel free to call your physician or the hospital operator if you have any questions, and they will be happy to assist you.     [facility] Anesthesia Department [address] [place] [phone]

## 2011-01-17 ENCOUNTER — Encounter (HOSPITAL_COMMUNITY): Admission: RE | Payer: Self-pay | Source: Ambulatory Visit

## 2011-01-17 ENCOUNTER — Ambulatory Visit (HOSPITAL_COMMUNITY): Admission: RE | Admit: 2011-01-17 | Payer: Medicare Other | Source: Ambulatory Visit | Admitting: Internal Medicine

## 2011-01-17 SURGERY — COLONOSCOPY WITH PROPOFOL
Anesthesia: Monitor Anesthesia Care

## 2011-01-26 ENCOUNTER — Encounter (HOSPITAL_COMMUNITY): Payer: Self-pay | Admitting: Emergency Medicine

## 2011-01-26 ENCOUNTER — Emergency Department (HOSPITAL_COMMUNITY)
Admission: EM | Admit: 2011-01-26 | Discharge: 2011-01-26 | Disposition: A | Discharge status: Home / Self Care | Payer: Medicare Other | Attending: Emergency Medicine | Admitting: Emergency Medicine

## 2011-01-26 DIAGNOSIS — T510X4A Toxic effect of ethanol, undetermined, initial encounter: Secondary | ICD-10-CM | POA: Insufficient documentation

## 2011-01-26 DIAGNOSIS — T394X2A Poisoning by antirheumatics, not elsewhere classified, intentional self-harm, initial encounter: Secondary | ICD-10-CM | POA: Insufficient documentation

## 2011-01-26 DIAGNOSIS — T400X1A Poisoning by opium, accidental (unintentional), initial encounter: Secondary | ICD-10-CM | POA: Insufficient documentation

## 2011-01-26 DIAGNOSIS — F10929 Alcohol use, unspecified with intoxication, unspecified: Secondary | ICD-10-CM

## 2011-01-26 DIAGNOSIS — T398X2A Poisoning by other nonopioid analgesics and antipyretics, not elsewhere classified, intentional self-harm, initial encounter: Secondary | ICD-10-CM | POA: Insufficient documentation

## 2011-01-26 DIAGNOSIS — T6592XA Toxic effect of unspecified substance, intentional self-harm, initial encounter: Secondary | ICD-10-CM | POA: Insufficient documentation

## 2011-01-26 DIAGNOSIS — T40601A Poisoning by unspecified narcotics, accidental (unintentional), initial encounter: Secondary | ICD-10-CM

## 2011-01-26 LAB — URINALYSIS, ROUTINE W REFLEX MICROSCOPIC
Bilirubin Urine: NEGATIVE
Hgb urine dipstick: NEGATIVE
Nitrite: NEGATIVE
Specific Gravity, Urine: >1.03 — ABNORMAL HIGH (ref 1.005–1.030)
pH: 5.5 (ref 5.0–8.0)

## 2011-01-26 LAB — RAPID URINE DRUG SCREEN, HOSP PERFORMED
Barbiturates: NOT DETECTED
Benzodiazepines: NOT DETECTED
Cocaine: NOT DETECTED
Opiates: NOT DETECTED

## 2011-01-26 LAB — ETHANOL: Alcohol, Ethyl (B): 195 mg/dL — ABNORMAL HIGH (ref 0–11)

## 2011-01-26 LAB — DIFFERENTIAL
Basophils Relative: 1 % (ref 0–1)
Eosinophils Absolute: 0.1 10*3/uL (ref 0.0–0.7)
Neutrophils Relative %: 50 % (ref 43–77)

## 2011-01-26 LAB — COMPREHENSIVE METABOLIC PANEL
ALT: 16 U/L (ref 0–53)
Albumin: 3.8 g/dL (ref 3.5–5.2)
Alkaline Phosphatase: 85 U/L (ref 39–117)
Potassium: 4 mEq/L (ref 3.5–5.1)
Sodium: 139 mEq/L (ref 135–145)
Total Protein: 6.9 g/dL (ref 6.0–8.3)

## 2011-01-26 LAB — CBC
MCH: 31.5 pg (ref 26.0–34.0)
MCHC: 34 g/dL (ref 30.0–36.0)
Platelets: 257 10*3/uL (ref 150–400)
RDW: 13.7 % (ref 11.5–15.5)

## 2011-01-26 MED ORDER — SIMVASTATIN 20 MG PO TABS
40.0000 mg | ORAL_TABLET | Freq: Every day | ORAL | Status: DC
  Filled 2011-01-26: qty 2

## 2011-01-26 MED ORDER — FUROSEMIDE 40 MG PO TABS: 20.0000 mg | ORAL_TABLET | Freq: Every day | ORAL | Status: DC

## 2011-01-26 MED ORDER — DOUBLE ANTIBIOTIC 500-10000 UNIT/GM EX OINT
TOPICAL_OINTMENT | Freq: Once | CUTANEOUS | Status: AC
  Administered 2011-01-26: 1 via TOPICAL
  Filled 2011-01-26: qty 1

## 2011-01-26 MED ORDER — LISINOPRIL 20 MG PO TABS
20.0000 mg | ORAL_TABLET | Freq: Every day | ORAL | Status: DC
  Filled 2011-01-26 (×2): qty 1
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
## 2011-01-26 NOTE — ED Notes (Signed)
Pt struck rt eyebrow area against cabinet when washing his hands.  Small contusion present.   Dr Dierdre Highman checked pt. Pt alert, cooperative at present.

## 2011-01-26 NOTE — ED Notes (Signed)
Pt telling Leeroy Bock he is threatening to leave, that we cannot hold him here; Carney Bern, [employer] in to see pt to calm him down, but pt refusing to stay; pt was to have taken tramadol to try to commit suicide; IVC papers filled out by EDP

## 2011-01-26 NOTE — ED Notes (Signed)
Bacitracin and bandaid to rt eyebrow.

## 2011-01-26 NOTE — ED Notes (Signed)
Pt angry about having to stay here.  Cont to say he wants to go home. And cursing at times.  Sitting on end of stretcher and refuses to lie down. "I'm going to sit here till I can go home".

## 2011-01-26 NOTE — ED Notes (Signed)
Talking to sitter.  Given coffee to drink

## 2011-01-26 NOTE — ED Notes (Signed)
Pt dwi earlier in the night, came home drank a pint and a half of rum and took 200mg  of tramidol
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
## 2011-01-30 ENCOUNTER — Encounter: Payer: Self-pay | Admitting: Family Medicine

## 2011-02-04 ENCOUNTER — Encounter: Payer: Medicare Other | Admitting: Family Medicine

## 2011-02-26 ENCOUNTER — Telehealth: Payer: Self-pay | Admitting: Family Medicine

## 2011-02-28 ENCOUNTER — Other Ambulatory Visit: Payer: Self-pay | Admitting: Family Medicine

## 2011-02-28 DIAGNOSIS — F329 Major depressive disorder, single episode, unspecified: Secondary | ICD-10-CM

## 2011-02-28 NOTE — Telephone Encounter (Signed)
He states he has been having problems with his cognitive thought process and has been very depressed.

## 2011-02-28 NOTE — Telephone Encounter (Signed)
CALLED PATIENT NO ANSWER

## 2011-02-28 NOTE — Telephone Encounter (Signed)
pls call pt and verify why he wants to see dr Gerilyn Pilgrim, is it pain management, (was just in Ed intoxicated, with questionable drug overdiose/abuse. Or does he want mental health for depression.  I tried calling phone was busy

## 2011-02-28 NOTE — Telephone Encounter (Signed)
pls let pt know he needs appt with psychiatry for this, can be referred to [facility] for eval by and treatment by therapist, pls refer and let him know. I will enter referral

## 2011-03-01 ENCOUNTER — Telehealth: Payer: Self-pay | Admitting: Family Medicine

## 2011-03-01 ENCOUNTER — Other Ambulatory Visit: Payer: Self-pay | Admitting: Family Medicine

## 2011-03-01 DIAGNOSIS — S069X9A Unspecified intracranial injury with loss of consciousness of unspecified duration, initial encounter: Secondary | ICD-10-CM

## 2011-03-01 NOTE — Telephone Encounter (Signed)
Then whatever happens just happens!!! And hung phone up on me.

## 2011-03-01 NOTE — Telephone Encounter (Signed)
Spoke with pt directly, reports due to traumatic brain injury he is concerned about confusion and abnormal behavior. He had been seeing dr Ninetta Lights in the past, neurologis in [place].  Please cancel referral to behavioral health and refer him to Dr Gerilyn Pilgrim, I will enter this referral (sorry about the confusion, I EXPLAINED TO PT i HAD ORDERED INITIAL REFFERAL, HE IS NOW CALM)

## 2011-03-01 NOTE — Telephone Encounter (Signed)
Called pt and told him we referred him to behavior health. Pt said i wanted dr. Gerilyn Pilgrim. i told him we had to send him to behavior health. He stated. Nevermind i will get one myself and whatever happens just happens. And hung the phone up on me.

## 2011-03-05 NOTE — Telephone Encounter (Signed)
Pt was referred to dr. Harriett Sine office. Pt was called and left message to call office back. So I could make him aware

## 2011-04-18 ENCOUNTER — Other Ambulatory Visit: Payer: Self-pay | Admitting: Family Medicine

## 2011-04-23 ENCOUNTER — Telehealth: Payer: Self-pay | Admitting: Family Medicine

## 2011-04-23 MED ORDER — LISINOPRIL 20 MG PO TABS: 20.0000 mg | ORAL_TABLET | Freq: Every day | ORAL | Status: DC

## 2011-04-23 NOTE — Telephone Encounter (Signed)
Refilled

## 2011-04-29 ENCOUNTER — Emergency Department (HOSPITAL_COMMUNITY)
Admission: EM | Admit: 2011-04-29 | Discharge: 2011-04-30 | Disposition: A | Discharge status: Home / Self Care | Payer: Medicare Other | Attending: Emergency Medicine | Admitting: Emergency Medicine

## 2011-04-29 ENCOUNTER — Other Ambulatory Visit: Payer: Self-pay

## 2011-04-29 ENCOUNTER — Emergency Department (HOSPITAL_COMMUNITY): Payer: Medicare Other

## 2011-04-29 ENCOUNTER — Encounter (HOSPITAL_COMMUNITY): Payer: Self-pay | Admitting: *Deleted

## 2011-04-29 DIAGNOSIS — J4489 Other specified chronic obstructive pulmonary disease: Secondary | ICD-10-CM | POA: Insufficient documentation

## 2011-04-29 DIAGNOSIS — Z87442 Personal history of urinary calculi: Secondary | ICD-10-CM | POA: Insufficient documentation

## 2011-04-29 DIAGNOSIS — I4891 Unspecified atrial fibrillation: Secondary | ICD-10-CM | POA: Insufficient documentation

## 2011-04-29 DIAGNOSIS — F0390 Unspecified dementia without behavioral disturbance: Secondary | ICD-10-CM | POA: Insufficient documentation

## 2011-04-29 DIAGNOSIS — J449 Chronic obstructive pulmonary disease, unspecified: Secondary | ICD-10-CM

## 2011-04-29 DIAGNOSIS — I10 Essential (primary) hypertension: Secondary | ICD-10-CM | POA: Insufficient documentation

## 2011-04-29 DIAGNOSIS — Z8701 Personal history of pneumonia (recurrent): Secondary | ICD-10-CM | POA: Insufficient documentation

## 2011-04-29 HISTORY — DX: Pneumonia, unspecified organism: J18.9

## 2011-04-29 MED ORDER — IPRATROPIUM BROMIDE 0.02 % IN SOLN
0.5000 mg | Freq: Once | RESPIRATORY_TRACT | Status: AC
  Administered 2011-04-30: 0.5 mg via RESPIRATORY_TRACT
  Filled 2011-04-29: qty 2.5

## 2011-04-29 MED ORDER — PREDNISONE 20 MG PO TABS
60.0000 mg | ORAL_TABLET | Freq: Once | ORAL | Status: AC
  Administered 2011-04-29: 60 mg via ORAL
  Filled 2011-04-29: qty 3

## 2011-04-29 MED ORDER — ALBUTEROL SULFATE (5 MG/ML) 0.5% IN NEBU
5.0000 mg | INHALATION_SOLUTION | Freq: Once | RESPIRATORY_TRACT | Status: AC
  Administered 2011-04-29: 5 mg via RESPIRATORY_TRACT
  Filled 2011-04-29: qty 1

## 2011-04-29 NOTE — ED Notes (Signed)
C/o SOB, runny nose x 4 days and mid back pain

## 2011-04-29 NOTE — ED Notes (Signed)
Patient reports to the ED with SOB and pain in his back. EKG done for precautionary measures.
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
## 2011-04-30 MED ORDER — DOXYCYCLINE HYCLATE 100 MG PO TABS
100.0000 mg | ORAL_TABLET | Freq: Once | ORAL | Status: AC
  Administered 2011-04-30: 100 mg via ORAL
  Filled 2011-04-30: qty 1

## 2011-04-30 MED ORDER — DOXYCYCLINE HYCLATE 100 MG PO CAPS: 100.0000 mg | ORAL_CAPSULE | Freq: Two times a day (BID) | ORAL | Status: AC

## 2011-04-30 MED ORDER — ALBUTEROL SULFATE HFA 108 (90 BASE) MCG/ACT IN AERS: 1.0000 | INHALATION_SPRAY | Freq: Four times a day (QID) | RESPIRATORY_TRACT | Status: DC | PRN

## 2011-04-30 MED ORDER — LEVOFLOXACIN 500 MG PO TABS: 500.0000 mg | ORAL_TABLET | Freq: Once | ORAL | Status: DC

## 2011-04-30 MED ORDER — PREDNISONE 10 MG PO TABS: 20.0000 mg | ORAL_TABLET | Freq: Every day | ORAL | Status: AC

## 2011-05-05 ENCOUNTER — Emergency Department (HOSPITAL_COMMUNITY)
Admission: EM | Admit: 2011-05-05 | Discharge: 2011-05-06 | Disposition: A | Discharge status: Home / Self Care | Payer: Medicare Other | Attending: Emergency Medicine | Admitting: Emergency Medicine

## 2011-05-05 ENCOUNTER — Encounter (HOSPITAL_COMMUNITY): Payer: Self-pay | Admitting: *Deleted

## 2011-05-05 DIAGNOSIS — F17200 Nicotine dependence, unspecified, uncomplicated: Secondary | ICD-10-CM | POA: Insufficient documentation

## 2011-05-05 DIAGNOSIS — J40 Bronchitis, not specified as acute or chronic: Secondary | ICD-10-CM | POA: Insufficient documentation

## 2011-05-05 DIAGNOSIS — R0602 Shortness of breath: Secondary | ICD-10-CM | POA: Insufficient documentation

## 2011-05-05 DIAGNOSIS — F0390 Unspecified dementia without behavioral disturbance: Secondary | ICD-10-CM | POA: Insufficient documentation

## 2011-05-05 DIAGNOSIS — I4891 Unspecified atrial fibrillation: Secondary | ICD-10-CM | POA: Insufficient documentation

## 2011-05-05 DIAGNOSIS — Z8701 Personal history of pneumonia (recurrent): Secondary | ICD-10-CM | POA: Insufficient documentation

## 2011-05-05 DIAGNOSIS — I10 Essential (primary) hypertension: Secondary | ICD-10-CM | POA: Insufficient documentation

## 2011-05-05 NOTE — ED Notes (Signed)
C/o feeling short of breath x 1 hour; denies pain; states was seen for same 5 days ago-taking abx (doxycycline) and prednisone for same.

## 2011-05-06 ENCOUNTER — Emergency Department (HOSPITAL_COMMUNITY): Payer: Medicare Other

## 2011-05-06 ENCOUNTER — Other Ambulatory Visit: Payer: Self-pay

## 2011-05-06 MED ORDER — ALBUTEROL SULFATE (5 MG/ML) 0.5% IN NEBU
2.5000 mg | INHALATION_SOLUTION | Freq: Once | RESPIRATORY_TRACT | Status: AC
  Administered 2011-05-06: 2.5 mg via RESPIRATORY_TRACT
  Filled 2011-05-06: qty 0.5

## 2011-05-06 MED ORDER — IPRATROPIUM BROMIDE 0.02 % IN SOLN
0.5000 mg | Freq: Once | RESPIRATORY_TRACT | Status: AC
Start: 2011-05-06 — End: 2011-05-06
  Administered 2011-05-06: 0.5 mg via RESPIRATORY_TRACT
  Filled 2011-05-06: qty 2.5

## 2011-05-06 MED ORDER — PREDNISONE 10 MG PO TABS: 20.0000 mg | ORAL_TABLET | Freq: Every day | ORAL | Status: DC

## 2011-05-06 MED ORDER — IPRATROPIUM BROMIDE 0.02 % IN SOLN
0.5000 mg | Freq: Once | RESPIRATORY_TRACT | Status: AC
  Administered 2011-05-06: 0.5 mg via RESPIRATORY_TRACT
  Filled 2011-05-06: qty 2.5
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
## 2011-10-14 ENCOUNTER — Ambulatory Visit (INDEPENDENT_AMBULATORY_CARE_PROVIDER_SITE_OTHER): Payer: Medicare Other | Admitting: Family Medicine

## 2011-10-14 ENCOUNTER — Other Ambulatory Visit: Payer: Self-pay | Admitting: Family Medicine

## 2011-10-14 ENCOUNTER — Encounter: Payer: Self-pay | Admitting: Family Medicine

## 2011-10-14 VITALS — BP 144/82 | HR 73 | Resp 18 | Ht 67.0 in | Wt 155.0 lb

## 2011-10-14 DIAGNOSIS — F17200 Nicotine dependence, unspecified, uncomplicated: Secondary | ICD-10-CM

## 2011-10-14 DIAGNOSIS — M25519 Pain in unspecified shoulder: Secondary | ICD-10-CM

## 2011-10-14 DIAGNOSIS — E785 Hyperlipidemia, unspecified: Secondary | ICD-10-CM

## 2011-10-14 DIAGNOSIS — M25512 Pain in left shoulder: Secondary | ICD-10-CM | POA: Insufficient documentation

## 2011-10-14 DIAGNOSIS — R5381 Other malaise: Secondary | ICD-10-CM

## 2011-10-14 DIAGNOSIS — F329 Major depressive disorder, single episode, unspecified: Secondary | ICD-10-CM

## 2011-10-14 DIAGNOSIS — I10 Essential (primary) hypertension: Secondary | ICD-10-CM

## 2011-10-14 DIAGNOSIS — I2510 Atherosclerotic heart disease of native coronary artery without angina pectoris: Secondary | ICD-10-CM

## 2011-10-14 DIAGNOSIS — E559 Vitamin D deficiency, unspecified: Secondary | ICD-10-CM

## 2011-10-14 DIAGNOSIS — Z1211 Encounter for screening for malignant neoplasm of colon: Secondary | ICD-10-CM

## 2011-10-14 DIAGNOSIS — Z125 Encounter for screening for malignant neoplasm of prostate: Secondary | ICD-10-CM

## 2011-10-14 MED ORDER — TRAMADOL HCL 50 MG PO TABS: ORAL_TABLET | ORAL | Status: DC

## 2011-10-14 MED ORDER — MIRTAZAPINE 30 MG PO TBDP: 30.0000 mg | ORAL_TABLET | Freq: Every day | ORAL | Status: DC

## 2011-10-14 NOTE — Patient Instructions (Addendum)
Annual wellness exam in  43month  Fasting lipid, cmp, PSA, tSH, vit D today    Tramadol 50mg  one daily as needed for uncontrolled left shoulder pain  Rectal exam today  You need a shingles vaccine and you will be given a script , check at your pharmacy to see if covered. [facility] [employer] and I believe [facility] administers this

## 2011-10-17 ENCOUNTER — Ambulatory Visit (HOSPITAL_COMMUNITY)
Admission: RE | Admit: 2011-10-17 | Discharge: 2011-10-17 | Disposition: A | Discharge status: Home / Self Care | Payer: Medicare Other | Source: Ambulatory Visit | Attending: Family Medicine | Admitting: Family Medicine

## 2011-10-17 DIAGNOSIS — I2510 Atherosclerotic heart disease of native coronary artery without angina pectoris: Secondary | ICD-10-CM | POA: Insufficient documentation

## 2011-10-17 DIAGNOSIS — J439 Emphysema, unspecified: Secondary | ICD-10-CM | POA: Insufficient documentation

## 2011-10-17 DIAGNOSIS — F17200 Nicotine dependence, unspecified, uncomplicated: Secondary | ICD-10-CM

## 2011-10-17 MED ORDER — IOHEXOL 300 MG/ML  SOLN
80.0000 mL | Freq: Once | INTRAMUSCULAR | Status: AC | PRN
  Administered 2011-10-17: 80 mL via INTRAVENOUS

## 2011-10-18 ENCOUNTER — Encounter: Payer: Self-pay | Admitting: Cardiology

## 2011-10-21 ENCOUNTER — Encounter: Payer: Self-pay | Admitting: Cardiology

## 2011-10-21 ENCOUNTER — Encounter: Payer: Self-pay | Admitting: *Deleted

## 2011-10-21 ENCOUNTER — Ambulatory Visit (INDEPENDENT_AMBULATORY_CARE_PROVIDER_SITE_OTHER): Payer: Medicare Other | Admitting: Cardiology

## 2011-10-21 VITALS — BP 142/80 | HR 81 | Wt 157.0 lb

## 2011-10-21 DIAGNOSIS — F17200 Nicotine dependence, unspecified, uncomplicated: Secondary | ICD-10-CM

## 2011-10-21 DIAGNOSIS — I2510 Atherosclerotic heart disease of native coronary artery without angina pectoris: Secondary | ICD-10-CM | POA: Insufficient documentation

## 2011-10-21 DIAGNOSIS — I10 Essential (primary) hypertension: Secondary | ICD-10-CM

## 2011-10-21 DIAGNOSIS — E785 Hyperlipidemia, unspecified: Secondary | ICD-10-CM

## 2011-10-21 DIAGNOSIS — F411 Generalized anxiety disorder: Secondary | ICD-10-CM

## 2011-10-21 LAB — CBC WITH DIFFERENTIAL/PLATELET
Eosinophils Absolute: 0.1 10*3/uL (ref 0.0–0.7)
Lymphocytes Relative: 33 % (ref 12–46)
Lymphs Abs: 1.8 10*3/uL (ref 0.7–4.0)
MCH: 31.2 pg (ref 26.0–34.0)
Neutro Abs: 2.9 10*3/uL (ref 1.7–7.7)
Neutrophils Relative %: 54 % (ref 43–77)
Platelets: 218 10*3/uL (ref 150–400)
RBC: 5.39 MIL/uL (ref 4.22–5.81)
WBC: 5.4 10*3/uL (ref 4.0–10.5)

## 2011-10-21 MED ORDER — ASPIRIN EC 81 MG PO TBEC: 81.0000 mg | DELAYED_RELEASE_TABLET | Freq: Every day | ORAL | Status: AC

## 2011-10-21 NOTE — Assessment & Plan Note (Signed)
This is a CT diagnosis. This suggests left main and three-vessel disease. His symptoms are currently not ischemic in nature, however, I have suggested cardiac catheterization. I would be reluctant to do a stress study in a patient with left main disease.  Explained the indication, risks, and potential outcome and he agrees to proceed. We'll arrange as an outpatient this week. Begin aspirin 81 mg a day.

## 2011-10-21 NOTE — Assessment & Plan Note (Signed)
Looking at his numbers, he should be on a statin. We'll discuss further after his catheterization.

## 2011-10-21 NOTE — Patient Instructions (Addendum)
Your physician has recommended you make the following change in your medication: start aspirin 81mg  1 tablet daily.  Your physician has requested that you have a cardiac catheterization. Cardiac catheterization is used to diagnose and/or treat various heart conditions. Doctors may recommend this procedure for a number of different reasons. The most common reason is to evaluate chest pain. Chest pain can be a symptom of coronary artery disease (CAD), and cardiac catheterization can show whether plaque is narrowing or blocking your heart's arteries. This procedure is also used to evaluate the valves, as well as measure the blood flow and oxygen levels in different parts of your heart. For further information please visit [email or web address] Please follow instruction sheet, as given.
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
## 2011-10-22 LAB — BASIC METABOLIC PANEL
CO2: 27 mEq/L (ref 19–32)
Calcium: 8.9 mg/dL (ref 8.4–10.5)
Potassium: 4.5 mEq/L (ref 3.5–5.3)
Sodium: 143 mEq/L (ref 135–145)

## 2011-10-23 ENCOUNTER — Other Ambulatory Visit: Payer: Self-pay | Admitting: Adult Health

## 2011-10-23 DIAGNOSIS — Z01818 Encounter for other preprocedural examination: Secondary | ICD-10-CM

## 2011-10-25 ENCOUNTER — Inpatient Hospital Stay (HOSPITAL_BASED_OUTPATIENT_CLINIC_OR_DEPARTMENT_OTHER)
Admission: RE | Admit: 2011-10-25 | Discharge: 2011-10-25 | Disposition: A | Discharge status: Home / Self Care | Payer: Medicare Other | Source: Ambulatory Visit | Attending: Cardiovascular Disease | Admitting: Cardiovascular Disease

## 2011-10-25 ENCOUNTER — Encounter (HOSPITAL_BASED_OUTPATIENT_CLINIC_OR_DEPARTMENT_OTHER)
Admission: RE | Disposition: A | Discharge status: Home / Self Care | Payer: Self-pay | Source: Ambulatory Visit | Attending: Cardiovascular Disease

## 2011-10-25 DIAGNOSIS — I10 Essential (primary) hypertension: Secondary | ICD-10-CM | POA: Insufficient documentation

## 2011-10-25 DIAGNOSIS — I2510 Atherosclerotic heart disease of native coronary artery without angina pectoris: Secondary | ICD-10-CM

## 2011-10-25 DIAGNOSIS — Z01818 Encounter for other preprocedural examination: Secondary | ICD-10-CM

## 2011-10-25 DIAGNOSIS — Z87820 Personal history of traumatic brain injury: Secondary | ICD-10-CM | POA: Insufficient documentation

## 2011-10-25 DIAGNOSIS — F0390 Unspecified dementia without behavioral disturbance: Secondary | ICD-10-CM | POA: Insufficient documentation

## 2011-10-25 DIAGNOSIS — E785 Hyperlipidemia, unspecified: Secondary | ICD-10-CM | POA: Insufficient documentation

## 2011-10-25 DIAGNOSIS — F17200 Nicotine dependence, unspecified, uncomplicated: Secondary | ICD-10-CM | POA: Insufficient documentation

## 2011-10-25 SURGERY — JV LEFT HEART CATHETERIZATION WITH CORONARY ANGIOGRAM
Anesthesia: Moderate Sedation

## 2011-10-25 MED ORDER — SODIUM CHLORIDE 0.9 % IJ SOLN: 3.0000 mL | INTRAMUSCULAR | Status: DC | PRN

## 2011-10-25 MED ORDER — SODIUM CHLORIDE 0.9 % IV SOLN: 250.0000 mL | INTRAVENOUS | Status: DC | PRN

## 2011-10-25 MED ORDER — ASPIRIN 81 MG PO CHEW
324.0000 mg | CHEWABLE_TABLET | ORAL | Status: AC
  Administered 2011-10-25: 324 mg via ORAL

## 2011-10-25 MED ORDER — ACETAMINOPHEN 325 MG PO TABS: 650.0000 mg | ORAL_TABLET | ORAL | Status: DC | PRN

## 2011-10-25 MED ORDER — SODIUM CHLORIDE 0.9 % IJ SOLN: 3.0000 mL | Freq: Two times a day (BID) | INTRAMUSCULAR | Status: DC

## 2011-10-25 MED ORDER — SODIUM CHLORIDE 0.9 % IV SOLN
INTRAVENOUS | Status: DC
  Administered 2011-10-25: 08:00:00 via INTRAVENOUS

## 2011-10-25 MED ORDER — SODIUM CHLORIDE 0.9 % IV SOLN: INTRAVENOUS | Status: AC
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
## 2011-10-25 NOTE — Progress Notes (Signed)
Bedrest begins @ 0930.  Tegaderm dressing applied to right groin site.

## 2011-10-25 NOTE — CV Procedure (Signed)
   Cardiac Catheterization Operative Report  Kent Morrow [id number] 7/26/20139:16 AM [name], MD  Procedure Performed:  1. Left Heart Catheterization 2. Selective Coronary Angiography 3. Left ventricular angiogram  Operator: Verne Carrow, MD  Indication:  Abnormal CT chest suggesting CAD with calcification of coronary arteries.                                      Procedure Details: The risks, benefits, complications, treatment options, and expected outcomes were discussed with the patient. The patient and/or family concurred with the proposed plan, giving informed consent. The patient was brought to the cath lab after IV hydration was begun and oral premedication was given. The patient was further sedated with Versed and Fentanyl. The right groin was prepped and draped in the usual manner. Using the modified Seldinger access technique, a 4 French sheath was placed in the right femoral artery. A JL5 catheter was used to engage the left main artery. A 3DRC catheter was used to engage the RCA. All catheter exchanges were performed over a wire.  A pigtail catheter was used to perform a left ventricular angiogram.  There were no immediate complications. The patient was taken to the recovery area in stable condition.   Hemodynamic Findings: Central aortic pressure: 258/84 Left ventricular pressure: 155/9/20  Angiographic Findings:  Left main:  20% mid plaque. This vessel is heavily calcified.   Left Anterior Descending Artery: Large caliber vessel that courses to the apex. The proximal vessel is heavily calcified. The mid vessel has diffuse 40% stenosis and is calcified. The distal vessel has non-obstructive plaque disease.   Circumflex Artery: Moderate sized intermediate branch with 40% stenosis. The Circumflex is small with no obstructive disease.   Right Coronary Artery: Large, dominant vessel with 30% proximal stenosis, 50-60% mid stenosis and mild plaque in the  PLA and PDA.   Left Ventricular Angiogram: LVEF 65-70%.   Impression: 1. Moderate non-obstructive CAD 2. Normal LV systolic function  Recommendations: Medical management with aggressive risk factor modification. Will start statin. Continue ASA. F/U Dr. Daleen Squibb 2-3 weeks.        Complications:  None. The patient tolerated the procedure well.

## 2011-10-25 NOTE — Interval H&P Note (Signed)
History and Physical Interval Note:  10/25/2011 7:46 AM  Kent Morrow  has presented today for surgery, with the diagnosis of cad  The various methods of treatment have been discussed with the patient and family. After consideration of risks, benefits and other options for treatment, the patient has consented to  Procedure(s) (LRB): JV LEFT HEART CATHETERIZATION WITH CORONARY ANGIOGRAM (N/A) as a surgical intervention .  The patient's history has been reviewed, patient examined, no change in status, stable for surgery.  I have reviewed the patient's chart and labs.  Questions were answered to the patient's satisfaction.     MCALHANY,CHRISTOPHER

## 2011-10-27 NOTE — Assessment & Plan Note (Signed)
Sub optimal control at thsi visit, no med change.  DASH diet and commitment to daily physical activity for a minimum of 30 minutes discussed and encouraged, as a part of hypertension management. The importance of attaining a healthy weight is also discussed.

## 2011-10-27 NOTE — Progress Notes (Signed)
  Subjective:    Patient ID: Kent Morrow, male    DOB: [birth year], 66 y.o.   MRN: [id number]  HPI The PT is here for follow up and re-evaluation of chronic medical conditions, medication management and review of any available recent lab and radiology data.  Preventive health is updated, specifically  Cancer screening and Immunization.   Questions or concerns regarding consultations or procedures which the PT has had in the interim are  Addressed.Has been in the Ed since last visit, problem now resolved The PT denies any adverse reactions to current medications since the last visit.  C/o increased and uncontrolled left shoulder pain with reduced function, wants medication, no interest in referral to PT or ortho at this  time      Review of Systems See HPI Denies recent fever or chills. Denies sinus pressure, nasal congestion, ear pain or sore throat. Denies chest congestion, productive cough or wheezing. Denies chest pains, palpitations and leg swelling Denies abdominal pain, nausea, vomiting,diarrhea or constipation.   Denies dysuria, frequency, hesitancy or incontinence. Denies headaches, seizures, numbness, or tingling. Denies depression or uncontrolled  anxiety c/o insomnia. Denies skin break down or rash.        Objective:   Physical Exam  Patient alert and oriented and in no cardiopulmonary distress.  HEENT: No facial asymmetry, EOMI, no sinus tenderness,  oropharynx pink and moist.  Neck supple no adenopathy.  Chest: Clear to auscultation bilaterally.decreased ai entry throughout  CVS: S1, S2 no murmurs, no S3.  ABD: Soft non tender. Bowel sounds normal. Rectal: no mass, prostate smooth, not enlarged, no nodules, guaiac neg stool Ext: No edema  MS: Adequate ROM spine,  hips and knees.Decreased ROM left shoulder  Skin: Intact, no ulcerations or rash noted.  Psych: Good eye contact, normal affect. Memory intact mildly anxious not  depressed appearing.  CNS:  CN 2-12 intact, power, tone and sensation normal throughout.       Assessment & Plan:

## 2011-10-27 NOTE — Assessment & Plan Note (Signed)
Longstanding h/o nicotine dependence , over 30 pack year h/o will screen for lung cancer

## 2011-10-27 NOTE — Assessment & Plan Note (Signed)
Hyperlipidemia:Low fat diet discussed and encouraged. \updated lab data needed

## 2011-10-27 NOTE — Assessment & Plan Note (Signed)
Uncontrolled and worsened with limitation in function, pt to start tramadol in low  dose

## 2011-10-28 LAB — POC HEMOCCULT BLD/STL (OFFICE/1-CARD/DIAGNOSTIC): Fecal Occult Blood, POC: NEGATIVE

## 2011-11-06 ENCOUNTER — Ambulatory Visit (INDEPENDENT_AMBULATORY_CARE_PROVIDER_SITE_OTHER): Payer: Medicare Other | Admitting: Adult Health

## 2011-11-06 ENCOUNTER — Encounter: Payer: Self-pay | Admitting: Adult Health

## 2011-11-06 ENCOUNTER — Encounter: Payer: Medicare Other | Admitting: Physician Assistant

## 2011-11-06 VITALS — BP 142/86 | HR 76 | Resp 16 | Ht 67.0 in | Wt 161.1 lb

## 2011-11-06 DIAGNOSIS — F17200 Nicotine dependence, unspecified, uncomplicated: Secondary | ICD-10-CM

## 2011-11-06 DIAGNOSIS — E785 Hyperlipidemia, unspecified: Secondary | ICD-10-CM

## 2011-11-06 DIAGNOSIS — I2510 Atherosclerotic heart disease of native coronary artery without angina pectoris: Secondary | ICD-10-CM

## 2011-11-06 NOTE — Assessment & Plan Note (Signed)
He has recently been placed on atorvastatin. He offers no complaints of myalgias. He will need followup labs in 3 months.

## 2011-11-06 NOTE — Assessment & Plan Note (Signed)
He says he has stopped smoking. I congratulated him on this. I am hopeful that he will not restart.

## 2011-11-06 NOTE — Assessment & Plan Note (Signed)
Cardiac cath results are reassuring. He will continue his ongoing risk management. He will continue atorvastatin and have followup labs completed in 3 months. He is encouraged not to restart smoking. He will continue aspirin daily. We will see him in 6 months. We will need to have periodic stress Myoview in the next 2-3 years her continued evaluation.
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
## 2011-12-07 LAB — LIPID PANEL
HDL: 57 mg/dL (ref >39)
Total CHOL/HDL Ratio: 2.5 Ratio
Triglycerides: 60 mg/dL (ref <150)

## 2011-12-07 LAB — COMPREHENSIVE METABOLIC PANEL
Alkaline Phosphatase: 80 U/L (ref 39–117)
BUN: 17 mg/dL (ref 6–23)
CO2: 28 mEq/L (ref 19–32)
Creat: 0.94 mg/dL (ref 0.50–1.35)
Glucose, Bld: 84 mg/dL (ref 70–99)
Total Bilirubin: 0.7 mg/dL (ref 0.3–1.2)

## 2011-12-07 LAB — TSH: TSH: 1.523 u[IU]/mL (ref 0.350–4.500)

## 2011-12-07 LAB — VITAMIN D 25 HYDROXY (VIT D DEFICIENCY, FRACTURES): Vit D, 25-Hydroxy: 19 ng/mL — ABNORMAL LOW (ref 30–89)

## 2011-12-16 ENCOUNTER — Encounter: Payer: Self-pay | Admitting: Family Medicine

## 2011-12-16 ENCOUNTER — Ambulatory Visit (INDEPENDENT_AMBULATORY_CARE_PROVIDER_SITE_OTHER): Payer: Medicare Other | Admitting: Family Medicine

## 2011-12-16 VITALS — BP 130/74 | HR 87 | Resp 18 | Ht 67.0 in | Wt 160.0 lb

## 2011-12-16 DIAGNOSIS — M62838 Other muscle spasm: Secondary | ICD-10-CM

## 2011-12-16 DIAGNOSIS — Z0000 Encounter for general adult medical examination without abnormal findings: Secondary | ICD-10-CM

## 2011-12-16 DIAGNOSIS — Z125 Encounter for screening for malignant neoplasm of prostate: Secondary | ICD-10-CM

## 2011-12-16 MED ORDER — TIZANIDINE HCL 2 MG PO CAPS: 2.0000 mg | ORAL_CAPSULE | Freq: Three times a day (TID) | ORAL | Status: DC
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
## 2011-12-16 NOTE — Patient Instructions (Addendum)
F/u in January  New med for muscle spasm, use at bedtime , as needed  I am happy that you feel better

## 2011-12-22 DIAGNOSIS — Z0000 Encounter for general adult medical examination without abnormal findings: Secondary | ICD-10-CM | POA: Insufficient documentation
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
## 2012-03-03 ENCOUNTER — Other Ambulatory Visit: Payer: Self-pay | Admitting: Family Medicine

## 2012-03-13 ENCOUNTER — Other Ambulatory Visit: Payer: Self-pay | Admitting: Family Medicine

## 2012-03-13 ENCOUNTER — Telehealth: Payer: Self-pay

## 2012-03-13 MED ORDER — TRAZODONE HCL 50 MG PO TABS: 50.0000 mg | ORAL_TABLET | Freq: Every day | ORAL | Status: DC

## 2012-03-13 NOTE — Telephone Encounter (Signed)
Discontinue remeron and start trazodone pls notify pt and pharmacist

## 2012-03-13 NOTE — Telephone Encounter (Signed)
Faxed to pharmacy

## 2012-03-18 ENCOUNTER — Encounter (HOSPITAL_COMMUNITY): Payer: Self-pay | Admitting: *Deleted

## 2012-03-18 ENCOUNTER — Telehealth: Payer: Self-pay

## 2012-03-18 ENCOUNTER — Other Ambulatory Visit: Payer: Self-pay | Admitting: Family Medicine

## 2012-03-18 ENCOUNTER — Emergency Department (HOSPITAL_COMMUNITY)
Admission: EM | Admit: 2012-03-18 | Discharge: 2012-03-18 | Disposition: A | Discharge status: Home / Self Care | Payer: Medicare Other | Attending: Emergency Medicine | Admitting: Emergency Medicine

## 2012-03-18 DIAGNOSIS — Z8739 Personal history of other diseases of the musculoskeletal system and connective tissue: Secondary | ICD-10-CM | POA: Insufficient documentation

## 2012-03-18 DIAGNOSIS — E785 Hyperlipidemia, unspecified: Secondary | ICD-10-CM | POA: Insufficient documentation

## 2012-03-18 DIAGNOSIS — F0390 Unspecified dementia without behavioral disturbance: Secondary | ICD-10-CM | POA: Insufficient documentation

## 2012-03-18 DIAGNOSIS — F329 Major depressive disorder, single episode, unspecified: Secondary | ICD-10-CM | POA: Insufficient documentation

## 2012-03-18 DIAGNOSIS — Z8679 Personal history of other diseases of the circulatory system: Secondary | ICD-10-CM | POA: Insufficient documentation

## 2012-03-18 DIAGNOSIS — Z8701 Personal history of pneumonia (recurrent): Secondary | ICD-10-CM | POA: Insufficient documentation

## 2012-03-18 DIAGNOSIS — Z87891 Personal history of nicotine dependence: Secondary | ICD-10-CM | POA: Insufficient documentation

## 2012-03-18 DIAGNOSIS — Z7982 Long term (current) use of aspirin: Secondary | ICD-10-CM | POA: Insufficient documentation

## 2012-03-18 DIAGNOSIS — I10 Essential (primary) hypertension: Secondary | ICD-10-CM | POA: Insufficient documentation

## 2012-03-18 DIAGNOSIS — F3289 Other specified depressive episodes: Secondary | ICD-10-CM | POA: Insufficient documentation

## 2012-03-18 DIAGNOSIS — F411 Generalized anxiety disorder: Secondary | ICD-10-CM | POA: Insufficient documentation

## 2012-03-18 DIAGNOSIS — Z79899 Other long term (current) drug therapy: Secondary | ICD-10-CM | POA: Insufficient documentation

## 2012-03-18 DIAGNOSIS — Z87820 Personal history of traumatic brain injury: Secondary | ICD-10-CM | POA: Insufficient documentation

## 2012-03-18 DIAGNOSIS — F419 Anxiety disorder, unspecified: Secondary | ICD-10-CM

## 2012-03-18 DIAGNOSIS — Z8709 Personal history of other diseases of the respiratory system: Secondary | ICD-10-CM | POA: Insufficient documentation

## 2012-03-18 DIAGNOSIS — Z8639 Personal history of other endocrine, nutritional and metabolic disease: Secondary | ICD-10-CM | POA: Insufficient documentation

## 2012-03-18 MED ORDER — LORAZEPAM 1 MG PO TABS
1.0000 mg | ORAL_TABLET | Freq: Once | ORAL | Status: AC
  Administered 2012-03-18: 1 mg via ORAL
  Filled 2012-03-18: qty 1

## 2012-03-18 MED ORDER — MIRTAZAPINE 30 MG PO TABS: ORAL_TABLET | ORAL | Status: DC

## 2012-03-18 NOTE — Telephone Encounter (Signed)
Since remeron is at [facility], please see if they can fill the script for pt therer, let him know send the script over and NO xanax PLEASE

## 2012-03-18 NOTE — ED Notes (Signed)
Pt reporting his physician changed his medication from Remeron to Trazadone about a week ago.  Reports that since he started taking the trazadone, he has felt more agitated and anxious.  Denies any other complaints.

## 2012-03-18 NOTE — Telephone Encounter (Signed)
Patient aware and will collect script at [facility]

## 2012-03-18 NOTE — Telephone Encounter (Signed)
Xanax one at bedtime number 30 has been written, pls fax after you speak with him He needs to make appt in 2 to 3 weeks.I will NOT refill the xanax without an OV He CANNOT rake more than ONE xanax at night, pls let him know

## 2012-03-18 NOTE — Addendum Note (Signed)
Addended by: Abner Greenspan on: 03/18/2012 02:31 PM   Modules accepted: Orders
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
## 2012-04-03 ENCOUNTER — Other Ambulatory Visit: Payer: Self-pay

## 2012-04-03 MED ORDER — ALBUTEROL SULFATE HFA 108 (90 BASE) MCG/ACT IN AERS: 1.0000 | INHALATION_SPRAY | Freq: Four times a day (QID) | RESPIRATORY_TRACT | Status: DC | PRN

## 2012-04-07 ENCOUNTER — Encounter: Payer: Self-pay | Admitting: Family Medicine

## 2012-04-07 ENCOUNTER — Ambulatory Visit (INDEPENDENT_AMBULATORY_CARE_PROVIDER_SITE_OTHER): Payer: Medicare Other | Admitting: Family Medicine

## 2012-04-07 VITALS — BP 114/82 | HR 62 | Resp 16 | Ht 67.0 in | Wt 165.0 lb

## 2012-04-07 DIAGNOSIS — F17200 Nicotine dependence, unspecified, uncomplicated: Secondary | ICD-10-CM

## 2012-04-07 DIAGNOSIS — F3289 Other specified depressive episodes: Secondary | ICD-10-CM

## 2012-04-07 DIAGNOSIS — F329 Major depressive disorder, single episode, unspecified: Secondary | ICD-10-CM

## 2012-04-07 DIAGNOSIS — M62838 Other muscle spasm: Secondary | ICD-10-CM

## 2012-04-07 DIAGNOSIS — Z1211 Encounter for screening for malignant neoplasm of colon: Secondary | ICD-10-CM

## 2012-04-07 DIAGNOSIS — E785 Hyperlipidemia, unspecified: Secondary | ICD-10-CM

## 2012-04-07 DIAGNOSIS — I10 Essential (primary) hypertension: Secondary | ICD-10-CM

## 2012-04-07 MED ORDER — TIZANIDINE HCL 2 MG PO CAPS: 2.0000 mg | ORAL_CAPSULE | Freq: Three times a day (TID) | ORAL | Status: DC

## 2012-04-07 NOTE — Progress Notes (Signed)
  Subjective:    Patient ID: Kent Morrow, male    DOB: 1945-04-04, 67 y.o.   MRN: [id number]  HPI The PT is here for follow up and re-evaluation of chronic medical conditions, medication management and review of any available recent lab and radiology data.  Preventive health is updated, specifically  Cancer screening and Immunization.   Questions or concerns regarding consultations or procedures which the PT has had in the interim are  addressed. The PT denies any adverse reactions to current medications since the last visit.  States his depression is lessened, and well managed on his current med which also helps both appetite and sleep. Still working on family relationships. Not suicidal, homicidal or excessively tearful. Has started smoking his pipe again, know he needs to quit but unwilling to set a quit dats. Now prepared to do colonoscopy so referral will be re entered. C/o chronic muscle pasm and neck which responds to medication    Review of Systems See HPI Denies recent fever or chills. Denies sinus pressure, nasal congestion, ear pain or sore throat. Denies chest congestion, productive cough or wheezing. Denies chest pains, palpitations and leg swelling Denies abdominal pain, nausea, vomiting,diarrhea or constipation.   Denies dysuria, frequency, hesitancy or incontinence.  Denies headaches, seizures, numbness, or tingling.  Denies skin break down or rash.        Objective:   Physical Exam Patient alert and oriented and in no cardiopulmonary distress.  HEENT: No facial asymmetry, EOMI no sinus tenderness,  oropharynx pink and moist.  Neck supple no adenopathy.  Chest: Clear to auscultation bilaterally.Decreased though adequate air entry  CVS: S1, S2 no murmurs, no S3.  ABD: Soft non tender. Bowel sounds normal.  Ext: No edema  MS: Adequate ROM spine, shoulders, hips and knees.  Skin: Intact, no ulcerations or rash noted.  Psych: Good eye contact, normal  affect. Memory intact not anxious or depressed appearing.  CNS: CN 2-12 intact, power, tone and sensation normal throughout.        Assessment & Plan:

## 2012-04-07 NOTE — Patient Instructions (Addendum)
F/u in 4 month, please call if you need me before.  I will be in touch with Dr Rourke's office regarding the colonoscopy, so expect a call from them.  Please check the pharmacy re the shingles vaccine as discussed.  You did get a pneumonia vaccine in 2012 per pharmacy record, next is due in 2017  Please continuee to work on discontinuing totally nicotine use, both cigarette and pipe

## 2012-04-26 ENCOUNTER — Encounter: Payer: Self-pay | Admitting: Family Medicine

## 2012-04-26 NOTE — Assessment & Plan Note (Signed)
Unchanged. Patient counseled for approximately 5 minutes regarding the health risks of ongoing nicotine use, specifically all types of cancer, heart disease, stroke and respiratory failure. The options available for help with cessation ,the behavioral changes to assist the process, and the option to either gradully reduce usage  Or abruptly stop.is also discussed. Pt is also encouraged to set specific goals in number of cigarettes used daily, as well as to set a quit date.  

## 2012-04-26 NOTE — Assessment & Plan Note (Signed)
Controlled, no change in medication  

## 2012-04-26 NOTE — Assessment & Plan Note (Signed)
Hyperlipidemia:Low fat diet discussed and encouraged.  Controlled, no change in medication   

## 2012-04-26 NOTE — Assessment & Plan Note (Signed)
Controlled, no change in medication DASH diet and commitment to daily physical activity for a minimum of 30 minutes discussed and encouraged, as a part of hypertension management. The importance of attaining a healthy weight is also discussed.  

## 2012-04-26 NOTE — Assessment & Plan Note (Signed)
Improved and controlled on current med, which also helps with sleep, continue same. Increased physical activity encouraged also

## 2012-05-05 ENCOUNTER — Telehealth: Payer: Self-pay

## 2012-05-05 NOTE — Telephone Encounter (Signed)
Pt said his last colonoscopy was about 10-12 years ago at [facility] and had no polyps.

## 2012-05-19 NOTE — Telephone Encounter (Signed)
LMOM to call to get colonoscopy scheduled.

## 2012-05-25 ENCOUNTER — Emergency Department (HOSPITAL_COMMUNITY)
Admission: EM | Admit: 2012-05-25 | Discharge: 2012-05-26 | Disposition: A | Discharge status: Home / Self Care | Payer: PRIVATE HEALTH INSURANCE | Attending: Emergency Medicine | Admitting: Emergency Medicine

## 2012-05-25 ENCOUNTER — Encounter (HOSPITAL_COMMUNITY): Payer: Self-pay | Admitting: *Deleted

## 2012-05-25 ENCOUNTER — Encounter: Payer: Self-pay | Admitting: Family Medicine

## 2012-05-25 ENCOUNTER — Emergency Department (HOSPITAL_COMMUNITY): Payer: PRIVATE HEALTH INSURANCE

## 2012-05-25 ENCOUNTER — Ambulatory Visit (INDEPENDENT_AMBULATORY_CARE_PROVIDER_SITE_OTHER): Payer: Medicare Other | Admitting: Family Medicine

## 2012-05-25 VITALS — BP 160/86 | HR 100 | Resp 18 | Ht 67.0 in | Wt 161.1 lb

## 2012-05-25 DIAGNOSIS — Z79899 Other long term (current) drug therapy: Secondary | ICD-10-CM | POA: Insufficient documentation

## 2012-05-25 DIAGNOSIS — R5381 Other malaise: Secondary | ICD-10-CM | POA: Insufficient documentation

## 2012-05-25 DIAGNOSIS — R079 Chest pain, unspecified: Secondary | ICD-10-CM | POA: Insufficient documentation

## 2012-05-25 DIAGNOSIS — Z8739 Personal history of other diseases of the musculoskeletal system and connective tissue: Secondary | ICD-10-CM | POA: Insufficient documentation

## 2012-05-25 DIAGNOSIS — Z7982 Long term (current) use of aspirin: Secondary | ICD-10-CM | POA: Insufficient documentation

## 2012-05-25 DIAGNOSIS — G4700 Insomnia, unspecified: Secondary | ICD-10-CM | POA: Insufficient documentation

## 2012-05-25 DIAGNOSIS — Z8701 Personal history of pneumonia (recurrent): Secondary | ICD-10-CM | POA: Insufficient documentation

## 2012-05-25 DIAGNOSIS — E785 Hyperlipidemia, unspecified: Secondary | ICD-10-CM | POA: Insufficient documentation

## 2012-05-25 DIAGNOSIS — R630 Anorexia: Secondary | ICD-10-CM | POA: Insufficient documentation

## 2012-05-25 DIAGNOSIS — Z8659 Personal history of other mental and behavioral disorders: Secondary | ICD-10-CM | POA: Insufficient documentation

## 2012-05-25 DIAGNOSIS — I10 Essential (primary) hypertension: Secondary | ICD-10-CM | POA: Insufficient documentation

## 2012-05-25 DIAGNOSIS — F329 Major depressive disorder, single episode, unspecified: Secondary | ICD-10-CM

## 2012-05-25 DIAGNOSIS — F1010 Alcohol abuse, uncomplicated: Secondary | ICD-10-CM

## 2012-05-25 DIAGNOSIS — F17200 Nicotine dependence, unspecified, uncomplicated: Secondary | ICD-10-CM | POA: Insufficient documentation

## 2012-05-25 DIAGNOSIS — Z8679 Personal history of other diseases of the circulatory system: Secondary | ICD-10-CM | POA: Insufficient documentation

## 2012-05-25 DIAGNOSIS — F3289 Other specified depressive episodes: Secondary | ICD-10-CM

## 2012-05-25 DIAGNOSIS — Z862 Personal history of diseases of the blood and blood-forming organs and certain disorders involving the immune mechanism: Secondary | ICD-10-CM | POA: Insufficient documentation

## 2012-05-25 LAB — CBC
Hemoglobin: 15.8 g/dL (ref 13.0–17.0)
MCH: 31.3 pg (ref 26.0–34.0)
MCV: 91.5 fL (ref 78.0–100.0)
RBC: 5.04 MIL/uL (ref 4.22–5.81)
WBC: 8.3 10*3/uL (ref 4.0–10.5)

## 2012-05-25 LAB — BASIC METABOLIC PANEL
CO2: 27 mEq/L (ref 19–32)
Calcium: 9.7 mg/dL (ref 8.4–10.5)
Chloride: 97 mEq/L (ref 96–112)
Glucose, Bld: 128 mg/dL — ABNORMAL HIGH (ref 70–99)
Sodium: 134 mEq/L — ABNORMAL LOW (ref 135–145)

## 2012-05-25 MED ORDER — ZOLPIDEM TARTRATE 5 MG PO TABS: 5.0000 mg | ORAL_TABLET | Freq: Every evening | ORAL | Status: DC | PRN

## 2012-05-25 MED ORDER — ALBUTEROL SULFATE (5 MG/ML) 0.5% IN NEBU
2.5000 mg | INHALATION_SOLUTION | Freq: Once | RESPIRATORY_TRACT | Status: AC
  Administered 2012-05-25: 2.5 mg via RESPIRATORY_TRACT
  Filled 2012-05-25: qty 0.5

## 2012-05-25 NOTE — Assessment & Plan Note (Signed)
He'll be given a short course of Ambien to use for sleep since she's been tried on trazodone and low dose benzo

## 2012-05-25 NOTE — Patient Instructions (Signed)
Use the ambien as needed Continue taking the remeron You will be referred to a psychiatrist No alcohol use! F/U 4 weeks - Dr. Lodema Hong

## 2012-05-25 NOTE — Assessment & Plan Note (Signed)
He denies using alcohol on a regular basis however he did have an eye opener this morning as well as vodka this weekend. He made a pact with me that he would no longer use alcohol to help him sleep or with his medications

## 2012-05-25 NOTE — ED Notes (Signed)
Pain lt abd and lt lower chest.  Onset 10 pm. Seen by MD today for feeling "tired"
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
## 2012-05-25 NOTE — Assessment & Plan Note (Signed)
Worsening depression I think he would benefit from counseling and psychiatry again. He would like someone to talk to a regular basis. At this point I will continue him on the Remeron it appears he's been tried on trazodone and a short acting benzo in the past but did not do well with these. He cannot remember any other medications he was on before. No suicidal ideation today. At the end of this visit he felt more calm. Advised him that it is not safe for him to use alcohol to help him sleep or for any of his problems. He will be referred to psychiatry urgently

## 2012-05-25 NOTE — Progress Notes (Addendum)
  Subjective:    Patient ID: Kent Morrow, male    DOB: 1945/08/16, 67 y.o.   MRN: [id number]  HPI  Patient presents with depression and insomnia. He has a long-standing history of major depression he is currently on Remeron by his PCP. Of note he was recently seen and was doing well. His depression started after his wife died in 25-Aug-2007 he's had a bad relationship with his children and is basically on his own. For unknown reasons he began to think about this this weekend and began, and has not slept. He has lost 4 pounds since his last visit with his PCP which was about 5 weeks ago. He denies any suicidal ideations or hallucinations. He states that he has been able sleep and wants someone to talk to. He does admit to drinking liquor this week and as well as beer he smelled of alcohol during that visit and asked for the last time he drank and he states that he had a beer at 5:30 this morning but this was not characteristic of him.  Review of Systems - per above  GEN- denies fatigue, fever, weight loss,weakness, recent illness HEENT- denies eye drainage, change in vision, nasal discharge, CVS- denies chest pain, palpitations RESP- denies SOB, cough, wheeze ABD- denies N/V, change in stools, abd pain Neuro- denies headache, dizziness, syncope, seizure activity       Objective:   Physical Exam GEN- NAD, alert and oriented x3, smells of alcohol HEENT- PERRL, EOMI, non injected sclera, pink conjunctiva, MMM, oropharynx clear Neck- Supple, CVS- RRR, no murmur RESP-CTAB EXT- No edema Pulses- Radial 2+ Neuro - no tremor or flap, CNII-XII in tact Psych- depressed affect, no SI, no hallucinations, normal thought process, not anxious appearing,  crying throughout exam       Assessment & Plan:

## 2012-05-26 ENCOUNTER — Telehealth: Payer: Self-pay | Admitting: Family Medicine

## 2012-05-26 NOTE — Telephone Encounter (Signed)
Call the left message for patient on both his cell phone in the home number. He was seen in the ED last night complaining of chest pain. He did not note any suicidal ideations or change in his mood.

## 2012-05-26 NOTE — Telephone Encounter (Signed)
Letter to pt

## 2012-05-28 NOTE — Telephone Encounter (Signed)
I called Kent Morrow and no answer on either number, I called both of his contacts and left messages for them to have him return my call I am checking on his mental status

## 2012-05-28 NOTE — Telephone Encounter (Signed)
Called pt again unable to get in contact with him

## 2012-05-29 NOTE — Telephone Encounter (Signed)
I called the patient again on both thumbs and left messages his contacts never called me back here we will Sheriff to his home to check on him as he was depressed and drinking  heavily

## 2012-05-29 NOTE — Telephone Encounter (Signed)
Sherriff office  Kent Morrow out to the patient home he was in the home he was doing well he refuses answer  phone calls at this time.

## 2012-06-26 ENCOUNTER — Other Ambulatory Visit: Payer: Self-pay | Admitting: Family Medicine

## 2012-06-28 ENCOUNTER — Other Ambulatory Visit: Payer: Self-pay | Admitting: Family Medicine

## 2012-07-13 ENCOUNTER — Telehealth: Payer: Self-pay | Admitting: Family Medicine

## 2012-07-13 NOTE — Telephone Encounter (Signed)
I have handed you pt's handicap sticker application. I have tried unsiccesfully top spk with pt , to be clear as to why he qualifies for this, unable to spk with him. If able pls get him on the phone to me in the next 2 days,no later than Wednesday of this week, otherwise docukment in his record the reasons he provides for needing this. Thanks

## 2012-07-22 NOTE — Telephone Encounter (Signed)
Called both numbers listed and was unable to reach patient.  Will send letter notifying that he must contact the office before form for handicap sticker will be completed.

## 2012-07-30 ENCOUNTER — Encounter: Payer: Self-pay | Admitting: Family Medicine

## 2012-07-30 ENCOUNTER — Ambulatory Visit (INDEPENDENT_AMBULATORY_CARE_PROVIDER_SITE_OTHER): Payer: PRIVATE HEALTH INSURANCE | Admitting: Family Medicine

## 2012-07-30 VITALS — BP 114/80 | HR 84 | Temp 98.1°F | Resp 16 | Wt 163.0 lb

## 2012-07-30 DIAGNOSIS — J309 Allergic rhinitis, unspecified: Secondary | ICD-10-CM

## 2012-07-30 MED ORDER — FLUTICASONE PROPIONATE 50 MCG/ACT NA SUSP: 2.0000 | Freq: Every day | NASAL | Status: DC

## 2012-07-30 MED ORDER — LORATADINE 10 MG PO TABS: 10.0000 mg | ORAL_TABLET | Freq: Every day | ORAL | Status: DC

## 2012-07-30 NOTE — Progress Notes (Signed)
  Subjective:    Patient ID: Kent Morrow, male    DOB: Mar 28, 1946, 67 y.o.   MRN: [id number]  HPI Patient here with sore throat sinus drainage for the past 24 hours. He is having clear drainage from his nose. He denies any cough fever nausea vomiting. He's not using medicines over-the-counter. He has had some sneezing. He states he is doing well regarding his depression but cannot give any particular reason for not answer his phone following through with psychiatry. He did stop the Ambien   Review of Systems - per above   GEN- denies fatigue, fever, weight loss,weakness, recent illness HEENT- denies eye drainage, change in vision,+ nasal discharge, CVS- denies chest pain, palpitations RESP- denies SOB, cough, wheeze Neuro- denies headache, dizziness, syncope, seizure activity      Objective:   Physical Exam   GEN- NAD, alert and oriented x3 HEENT- PERRL, EOMI, non injected sclera, pink conjunctiva, MMM, oropharynx mild injection, TM clear bilat no effusion, + maxillary sinus tenderness, inflammed turbinates,+  Nasal drainage  Neck- Supple, no LAD CVS- RRR, no murmur RESP-CTAB Pulses- Radial 2+        Assessment & Plan:

## 2012-07-30 NOTE — Assessment & Plan Note (Signed)
Does not appear to have actual infection at this time we'll treat him for allergic rhinitis and sinus drainage she will use Flonase and Claritin

## 2012-07-30 NOTE — Patient Instructions (Signed)
Treating allergic rhinitis- sinus drainage Take the flonase as directed Allergy pill once a day F/U Dr. Lodema Hong 4 weeks

## 2012-08-27 ENCOUNTER — Ambulatory Visit (INDEPENDENT_AMBULATORY_CARE_PROVIDER_SITE_OTHER): Payer: PRIVATE HEALTH INSURANCE | Admitting: Family Medicine

## 2012-08-27 ENCOUNTER — Encounter: Payer: Self-pay | Admitting: Family Medicine

## 2012-08-27 VITALS — BP 142/80 | HR 68 | Resp 18 | Ht 67.0 in | Wt 164.1 lb

## 2012-08-27 DIAGNOSIS — G4700 Insomnia, unspecified: Secondary | ICD-10-CM

## 2012-08-27 DIAGNOSIS — E785 Hyperlipidemia, unspecified: Secondary | ICD-10-CM

## 2012-08-27 DIAGNOSIS — F17200 Nicotine dependence, unspecified, uncomplicated: Secondary | ICD-10-CM

## 2012-08-27 DIAGNOSIS — F1010 Alcohol abuse, uncomplicated: Secondary | ICD-10-CM

## 2012-08-27 DIAGNOSIS — M545 Low back pain: Secondary | ICD-10-CM

## 2012-08-27 DIAGNOSIS — I10 Essential (primary) hypertension: Secondary | ICD-10-CM

## 2012-08-27 DIAGNOSIS — F329 Major depressive disorder, single episode, unspecified: Secondary | ICD-10-CM

## 2012-08-27 DIAGNOSIS — Z1211 Encounter for screening for malignant neoplasm of colon: Secondary | ICD-10-CM

## 2012-08-27 NOTE — Patient Instructions (Addendum)
F/u in 4.5 month, call if yoou need me before  Fasting lipid and hepatic this week if possible , past due  Please DO quit all nicotine [birth year] your [birth year]  You are referred for evaluation to see if you need oxygen at home while sleeping  You are referred for colonoscopy in August per your request

## 2012-08-27 NOTE — Progress Notes (Signed)
  Subjective:    Patient ID: Kent Morrow, male    DOB: 1945/09/13, 67 y.o.   MRN: [id number]  HPI The PT is here for follow up and re-evaluation of chronic medical conditions, medication management and review of any available recent lab and radiology data.  Preventive health is updated, specifically  Cancer screening and Immunization.  No agreeable to colonoscopy so he will be referred . The PT denies any adverse reactions to current medications since the last visit.  There are no new concerns.  There are no specific complaints       Review of Systems See HPI Denies recent fever or chills. Denies sinus pressure, nasal congestion, ear pain or sore throat. Denies chest congestion, productive cough or wheezing. Denies chest pains, palpitations and leg swelling Denies abdominal pain, nausea, vomiting,diarrhea or constipation.   Denies dysuria, frequency, hesitancy or incontinence. Intermittent back and lower extremity pain, not disabling, but does cause some limitation in mobility  Denies headaches, seizures, numbness, or tingling. Denies uncontrolled  depression, anxiety or insomnia. Denies skin break down or rash.        Objective:   Physical Exam Patient alert and oriented and in no cardiopulmonary distress.  HEENT: No facial asymmetry, EOMI, no sinus tenderness,  oropharynx pink and moist.  Neck supple no adenopathy.  Chest: Clear to auscultation bilaterally.Decreased air entry though adequate  CVS: S1, S2 no murmurs, no S3.  ABD: Soft non tender. Bowel sounds normal.  Ext: No edema  MS: Decreased  ROM spine, shoulders, hips and knees.  Skin: Intact, no ulcerations or rash noted.  Psych: Good eye contact, normal affect. Memory intact  anxious tearful at times, mildly depressed appearing.  CNS: CN 2-12 intact, power,  normal throughout.        Assessment & Plan:

## 2012-08-29 NOTE — Assessment & Plan Note (Signed)
Adequate control, though sub optimal DASH diet and commitment to daily physical activity for a minimum of 30 minutes discussed and encouraged, as a part of hypertension management. The importance of attaining a healthy weight is also discussed.

## 2012-08-29 NOTE — Assessment & Plan Note (Addendum)
Pt trying to quit, cutting back, has quit date set for his birthday Patient counseled for approximately 5 minutes regarding the health risks of ongoing nicotine use, specifically all types of cancer, heart disease, stroke and respiratory failure. The options available for help with cessation ,the behavioral changes to assist the process, and the option to either gradully reduce usage  Or abruptly stop.is also discussed. Pt is also encouraged to set specific goals in number of cigarettes used daily, as well as to set a quit date.

## 2012-08-29 NOTE — Assessment & Plan Note (Signed)
Currently stable, never did go to therapy and has no interest at this time Denies suicidal or homicidal ideation Continue current meds

## 2012-08-29 NOTE — Assessment & Plan Note (Signed)
Denies current over use of alcohol, states he drinks 1.5 to 3 oz brandy at night to help him to sleep, will reduce volume to 1.5 ounces

## 2012-08-29 NOTE — Assessment & Plan Note (Signed)
Reports in intermittent, as needed use of tramadol and muscle relaxant

## 2012-08-29 NOTE — Assessment & Plan Note (Signed)
Sleep hygiene reviewed, continue current medication

## 2012-08-29 NOTE — Assessment & Plan Note (Signed)
Hyperlipidemia:Low fat diet discussed and encouraged.  Updated lab needed 

## 2012-09-04 ENCOUNTER — Other Ambulatory Visit: Payer: Self-pay | Admitting: Family Medicine

## 2012-09-17 ENCOUNTER — Telehealth: Payer: Self-pay

## 2012-09-17 NOTE — Telephone Encounter (Signed)
Pt referral was again initiated from Dr. Lodema Hong. I LMOM for a return call from pt.

## 2012-09-24 NOTE — Telephone Encounter (Signed)
Called. Many rings and no answer.  

## 2012-09-25 NOTE — Telephone Encounter (Signed)
Letter to pt and a documentation note to Dr. Lodema Hong.

## 2012-09-25 NOTE — Progress Notes (Unsigned)
Dr. Lodema Hong, I have made many attempts to reach this patient to schedule colonoscopy since 12/12/2010. In addition to unable to reach by phone, I mailed him letters on 12/12/2010, 04/28/2012, 05/26/2012, 08/27/2012, and I have just mailed him one more.

## 2012-09-28 NOTE — Progress Notes (Signed)
Noted, sorry about the frustrations, you have done "all you can" will discuss with him at next visit. In the past was "afraid" to do test, I got impression recently this had changed No more attempts needed, until I next see him, and if possible, will see if he can actually walk into the office  to follow through with appt shcedulng, pls let me know if this is an option (no guarantee this will work Scientist, physiological but will be last effort unless he specifically requests)

## 2012-09-28 NOTE — Progress Notes (Signed)
Thanks

## 2012-11-02 ENCOUNTER — Other Ambulatory Visit: Payer: Self-pay

## 2012-11-02 MED ORDER — ATORVASTATIN CALCIUM 40 MG PO TABS: 40.0000 mg | ORAL_TABLET | Freq: Every day | ORAL | Status: DC

## 2012-12-09 ENCOUNTER — Other Ambulatory Visit: Payer: Self-pay | Admitting: Family Medicine

## 2013-01-08 ENCOUNTER — Other Ambulatory Visit: Payer: Self-pay | Admitting: Family Medicine

## 2013-01-08 LAB — HEPATIC FUNCTION PANEL
ALT: 22 U/L (ref 0–53)
AST: 17 U/L (ref 0–37)
Albumin: 3.9 g/dL (ref 3.5–5.2)
Bilirubin, Direct: 0.1 mg/dL (ref 0.0–0.3)
Total Protein: 6.3 g/dL (ref 6.0–8.3)

## 2013-01-08 LAB — LIPID PANEL
Cholesterol: 148 mg/dL (ref 0–200)
HDL: 65 mg/dL (ref >39)
Total CHOL/HDL Ratio: 2.3 Ratio
VLDL: 13 mg/dL (ref 0–40)

## 2013-01-12 ENCOUNTER — Ambulatory Visit (INDEPENDENT_AMBULATORY_CARE_PROVIDER_SITE_OTHER): Payer: PRIVATE HEALTH INSURANCE | Admitting: Family Medicine

## 2013-01-12 ENCOUNTER — Other Ambulatory Visit: Payer: Self-pay | Admitting: Family Medicine

## 2013-01-12 ENCOUNTER — Encounter (INDEPENDENT_AMBULATORY_CARE_PROVIDER_SITE_OTHER): Payer: Self-pay

## 2013-01-12 ENCOUNTER — Telehealth: Payer: Self-pay

## 2013-01-12 ENCOUNTER — Encounter: Payer: Self-pay | Admitting: Family Medicine

## 2013-01-12 VITALS — BP 124/78 | HR 75 | Resp 16 | Ht 67.0 in | Wt 165.0 lb

## 2013-01-12 DIAGNOSIS — F329 Major depressive disorder, single episode, unspecified: Secondary | ICD-10-CM

## 2013-01-12 DIAGNOSIS — Z1211 Encounter for screening for malignant neoplasm of colon: Secondary | ICD-10-CM

## 2013-01-12 DIAGNOSIS — J449 Chronic obstructive pulmonary disease, unspecified: Secondary | ICD-10-CM

## 2013-01-12 DIAGNOSIS — Z9119 Patient's noncompliance with other medical treatment and regimen: Secondary | ICD-10-CM

## 2013-01-12 DIAGNOSIS — R5381 Other malaise: Secondary | ICD-10-CM

## 2013-01-12 DIAGNOSIS — I10 Essential (primary) hypertension: Secondary | ICD-10-CM

## 2013-01-12 DIAGNOSIS — F17200 Nicotine dependence, unspecified, uncomplicated: Secondary | ICD-10-CM

## 2013-01-12 DIAGNOSIS — Z125 Encounter for screening for malignant neoplasm of prostate: Secondary | ICD-10-CM

## 2013-01-12 LAB — POC HEMOCCULT BLD/STL (OFFICE/1-CARD/DIAGNOSTIC): Fecal Occult Blood, POC: NEGATIVE

## 2013-01-12 LAB — BASIC METABOLIC PANEL
BUN: 15 mg/dL (ref 6–23)
Calcium: 9.4 mg/dL (ref 8.4–10.5)
Chloride: 105 mEq/L (ref 96–112)
Creat: 0.8 mg/dL (ref 0.50–1.35)

## 2013-01-12 MED ORDER — FLUTICASONE-SALMETEROL 100-50 MCG/DOSE IN AEPB: 1.0000 | INHALATION_SPRAY | Freq: Two times a day (BID) | RESPIRATORY_TRACT | Status: DC

## 2013-01-12 MED ORDER — BUDESONIDE-FORMOTEROL FUMARATE 80-4.5 MCG/ACT IN AERO: 2.0000 | INHALATION_SPRAY | Freq: Two times a day (BID) | RESPIRATORY_TRACT | Status: DC

## 2013-01-12 MED ORDER — TIOTROPIUM BROMIDE MONOHYDRATE 18 MCG IN CAPS: 18.0000 ug | ORAL_CAPSULE | Freq: Every day | RESPIRATORY_TRACT | Status: DC

## 2013-01-12 NOTE — Telephone Encounter (Signed)
Faxed to pharmacy

## 2013-01-12 NOTE — Telephone Encounter (Signed)
Interestingly when I entered [employer] it said it was covered. I have printed advair pls fax and let pt know

## 2013-01-12 NOTE — Patient Instructions (Addendum)
Annual wellness in end February, call if you need me before  Rectal exam, no mass, heme negative stool  CONGRATS on smoking cessation  New inhalers prescribed to help to improve your breathing, use every day. Call if too expensive  Non fasting  hCT and PSa end February  Keep cooking!  Keep active, and keep a positive outlook  Limit alcohol

## 2013-01-12 NOTE — Telephone Encounter (Signed)
Will enter advair in its place

## 2013-01-17 ENCOUNTER — Encounter: Payer: Self-pay | Admitting: Family Medicine

## 2013-01-17 DIAGNOSIS — Z9119 Patient's noncompliance with other medical treatment and regimen: Secondary | ICD-10-CM | POA: Insufficient documentation

## 2013-01-17 NOTE — Assessment & Plan Note (Signed)
Controlled, no change in medication DASH diet and commitment to daily physical activity for a minimum of 30 minutes discussed and encouraged, as a part of hypertension management. The importance of attaining a healthy weight is also discussed.  

## 2013-01-17 NOTE — Assessment & Plan Note (Signed)
Improved, controlled on current med

## 2013-01-17 NOTE — Assessment & Plan Note (Addendum)
Continues to resist colonoscopy, citing cost as the barrier. Rectal exam today is normal Also unable to afford flu vaccine

## 2013-01-17 NOTE — Assessment & Plan Note (Signed)
Moderately severe, will benefit from regular use of inhalers, same prescribed. Fortunately no longer smokes

## 2013-01-17 NOTE — Progress Notes (Signed)
  Subjective:    Patient ID: Kent Morrow, male    DOB: Mar 25, 1946, 67 y.o.   MRN: [id number]  HPI The PT is here for follow up and re-evaluation of chronic medical conditions, medication management and review of any available recent lab and radiology data.  Preventive health is updated, specifically  Cancer screening and Immunization.refgusing colonoscopy still and unable to afford zostavax   Proudly states he quit nicotine on his birthday which is wonderful. The PT denies any adverse reactions to current medications since the last visit.  There are no new concerns. Ongoing alcohol use which he thinks is not a problem at this  time,using "just a little at night"encouraged to cut back use, at times has come in smelling of alcohol , today mild  There are no specific complaints       Review of Systems See HPI Denies recent fever or chills. Denies sinus pressure, nasal congestion, ear pain or sore throat. Denies chest congestion, productive cough or wheezing. Denies chest pains, palpitations and leg swelling Denies abdominal pain, nausea, vomiting,diarrhea or constipation.   Denies dysuria, frequency, hesitancy or incontinence. Chronic  joint pain, swelling and limitation in mobility. Denies headaches, seizures, numbness, or tingling. Denies  Uncontrolled depression, anxiety or insomnia. Denies skin break down or rash.        Objective:   Physical Exam  Patient alert and oriented and in no cardiopulmonary distress.  HEENT: No facial asymmetry, EOMI, no sinus tenderness,  oropharynx pink and moist.  Neck supple no adenopathy.  Chest: Clear to auscultation bilaterally.Markedly reduced air entry throughout  CVS: S1, S2 no murmurs, no S3.  ABD: Soft non tender. Bowel sounds normal.No organomegaly or mass Rectal: no mass, heme negative stool Ext: No edema  QM:VHQIONGE though reduced ROM spine, shoulders, hips and knees.  Skin: Intact, no ulcerations or rash  noted.  Psych: Good eye contact, normal affect. Memory intact not anxious or depressed appearing.  CNS: CN 2-12 intact, power, tone and sensation normal throughout.       Assessment & Plan:

## 2013-03-02 ENCOUNTER — Other Ambulatory Visit: Payer: Self-pay | Admitting: Family Medicine

## 2013-04-26 ENCOUNTER — Other Ambulatory Visit: Payer: Self-pay | Admitting: Cardiovascular Disease

## 2013-04-29 ENCOUNTER — Ambulatory Visit: Payer: PRIVATE HEALTH INSURANCE | Admitting: Adult Health

## 2013-04-30 ENCOUNTER — Encounter: Payer: Self-pay | Admitting: Adult Health

## 2013-04-30 ENCOUNTER — Ambulatory Visit (INDEPENDENT_AMBULATORY_CARE_PROVIDER_SITE_OTHER): Payer: PRIVATE HEALTH INSURANCE | Admitting: Adult Health

## 2013-04-30 VITALS — BP 143/79 | HR 80 | Ht 67.0 in | Wt 158.0 lb

## 2013-04-30 DIAGNOSIS — E785 Hyperlipidemia, unspecified: Secondary | ICD-10-CM

## 2013-04-30 DIAGNOSIS — I10 Essential (primary) hypertension: Secondary | ICD-10-CM

## 2013-04-30 DIAGNOSIS — I2510 Atherosclerotic heart disease of native coronary artery without angina pectoris: Secondary | ICD-10-CM

## 2013-04-30 DIAGNOSIS — Z8679 Personal history of other diseases of the circulatory system: Secondary | ICD-10-CM

## 2013-04-30 MED ORDER — ATORVASTATIN CALCIUM 40 MG PO TABS: 40.0000 mg | ORAL_TABLET | Freq: Every day | ORAL | Status: DC
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
## 2013-04-30 NOTE — Assessment & Plan Note (Signed)
We will refill atorvastatin. Labs are followed by his primary care physician, Dr. Lodema HongSimpson.

## 2013-04-30 NOTE — Assessment & Plan Note (Signed)
Blood pressure is well-controlled currently. We will not make any changes in his medications. He will continue on lisinopril 20 mg daily. He is not on a beta blocker secondary to lung disease.

## 2013-04-30 NOTE — Patient Instructions (Signed)
Your physician recommends that you schedule a follow-up appointment in: 1 year with Dr Koneswaran You will receive a reminder letter two months in advance reminding you to call and schedule your appointment. If you don't receive this letter, please contact our office.  Your physician recommends that you continue on your current medications as directed. Please refer to the Current Medication list given to you today.     

## 2013-04-30 NOTE — Assessment & Plan Note (Addendum)
He denies symptoms of angina weakness or dyspnea on exertion. Most recent cardiac catheterization in July 2013 demonstrated nonobstructive CAD with preserved LV systolic function. He will continue on medical management and risk modification. No further cardiac testing is planned at this time as he is asymptomatic  He will followup with Dr. Beulah GandyKoneswarin to be est. with him as his cardiologist.
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
## 2013-04-30 NOTE — Assessment & Plan Note (Signed)
He remains in normal sinus rhythm, rate control. He is not on any rate influence in medications.

## 2013-05-25 ENCOUNTER — Encounter: Payer: PRIVATE HEALTH INSURANCE | Admitting: Family Medicine

## 2013-07-13 ENCOUNTER — Telehealth: Payer: Self-pay | Admitting: Adult Health

## 2013-07-13 MED ORDER — LISINOPRIL 20 MG PO TABS: ORAL_TABLET | ORAL | Status: DC

## 2013-07-13 NOTE — Telephone Encounter (Signed)
Medication sent via escribe.  

## 2013-07-19 ENCOUNTER — Telehealth: Payer: Self-pay | Admitting: Family Medicine

## 2013-07-19 DIAGNOSIS — Z125 Encounter for screening for malignant neoplasm of prostate: Secondary | ICD-10-CM

## 2013-07-19 DIAGNOSIS — E785 Hyperlipidemia, unspecified: Secondary | ICD-10-CM

## 2013-07-19 DIAGNOSIS — I10 Essential (primary) hypertension: Secondary | ICD-10-CM

## 2013-07-19 NOTE — Telephone Encounter (Signed)
Will mail labs with msg to do 3 days before his appt

## 2013-07-19 NOTE — Addendum Note (Signed)
Addended by: Abner GreenspanHUDY, Jayelle Page H on: 07/19/2013 07:50 AM   Modules accepted: Orders

## 2013-07-19 NOTE — Telephone Encounter (Addendum)
No appt on sched , he needs an appt fow anniual wellness pls, calll, advise and schedule as able , thnanks. He will also needs labs fasing for the visit, pls let him know and  forward this msg to Merry ProudBrandi so she can order the lipid cmp and PSA , and cBc he needs. ( I am also sending her the message)

## 2013-07-23 NOTE — Telephone Encounter (Signed)
Left message on cell number to please call back

## 2013-08-05 ENCOUNTER — Encounter: Payer: Self-pay | Admitting: Family Medicine

## 2013-08-05 NOTE — Telephone Encounter (Signed)
Left message on cell to call back. 

## 2013-08-22 ENCOUNTER — Emergency Department (HOSPITAL_COMMUNITY): Payer: PRIVATE HEALTH INSURANCE

## 2013-08-22 ENCOUNTER — Encounter (HOSPITAL_COMMUNITY): Payer: Self-pay | Admitting: Emergency Medicine

## 2013-08-22 ENCOUNTER — Emergency Department (HOSPITAL_COMMUNITY)
Admission: EM | Admit: 2013-08-22 | Discharge: 2013-08-22 | Disposition: A | Discharge status: Home / Self Care | Payer: PRIVATE HEALTH INSURANCE | Attending: Emergency Medicine | Admitting: Emergency Medicine

## 2013-08-22 DIAGNOSIS — Z9889 Other specified postprocedural states: Secondary | ICD-10-CM | POA: Insufficient documentation

## 2013-08-22 DIAGNOSIS — Z8701 Personal history of pneumonia (recurrent): Secondary | ICD-10-CM | POA: Insufficient documentation

## 2013-08-22 DIAGNOSIS — I4891 Unspecified atrial fibrillation: Secondary | ICD-10-CM | POA: Insufficient documentation

## 2013-08-22 DIAGNOSIS — M129 Arthropathy, unspecified: Secondary | ICD-10-CM | POA: Insufficient documentation

## 2013-08-22 DIAGNOSIS — F411 Generalized anxiety disorder: Secondary | ICD-10-CM | POA: Insufficient documentation

## 2013-08-22 DIAGNOSIS — Z79899 Other long term (current) drug therapy: Secondary | ICD-10-CM | POA: Insufficient documentation

## 2013-08-22 DIAGNOSIS — IMO0002 Reserved for concepts with insufficient information to code with codable children: Secondary | ICD-10-CM | POA: Insufficient documentation

## 2013-08-22 DIAGNOSIS — E785 Hyperlipidemia, unspecified: Secondary | ICD-10-CM | POA: Insufficient documentation

## 2013-08-22 DIAGNOSIS — F17200 Nicotine dependence, unspecified, uncomplicated: Secondary | ICD-10-CM | POA: Insufficient documentation

## 2013-08-22 DIAGNOSIS — F0390 Unspecified dementia without behavioral disturbance: Secondary | ICD-10-CM | POA: Insufficient documentation

## 2013-08-22 DIAGNOSIS — R002 Palpitations: Secondary | ICD-10-CM

## 2013-08-22 DIAGNOSIS — I10 Essential (primary) hypertension: Secondary | ICD-10-CM | POA: Insufficient documentation

## 2013-08-22 LAB — CBC WITH DIFFERENTIAL/PLATELET
BASOS ABS: 0 10*3/uL (ref 0.0–0.1)
Basophils Relative: 1 % (ref 0–1)
EOS PCT: 0 % (ref 0–5)
Eosinophils Absolute: 0 10*3/uL (ref 0.0–0.7)
HCT: 47.3 % (ref 39.0–52.0)
Hemoglobin: 16 g/dL (ref 13.0–17.0)
Lymphocytes Relative: 30 % (ref 12–46)
Lymphs Abs: 1.8 10*3/uL (ref 0.7–4.0)
MCH: 32.7 pg (ref 26.0–34.0)
MCHC: 33.8 g/dL (ref 30.0–36.0)
MCV: 96.7 fL (ref 78.0–100.0)
MONO ABS: 0.7 10*3/uL (ref 0.1–1.0)
Monocytes Relative: 12 % (ref 3–12)
Neutro Abs: 3.5 10*3/uL (ref 1.7–7.7)
Neutrophils Relative %: 57 % (ref 43–77)
Platelets: 301 10*3/uL (ref 150–400)
RBC: 4.89 MIL/uL (ref 4.22–5.81)
RDW: 13.6 % (ref 11.5–15.5)
WBC: 6.1 10*3/uL (ref 4.0–10.5)

## 2013-08-22 LAB — COMPREHENSIVE METABOLIC PANEL
ALT: 67 U/L — ABNORMAL HIGH (ref 0–53)
AST: 83 U/L — AB (ref 0–37)
Albumin: 3.6 g/dL (ref 3.5–5.2)
Alkaline Phosphatase: 76 U/L (ref 39–117)
BUN: 13 mg/dL (ref 6–23)
CALCIUM: 9.7 mg/dL (ref 8.4–10.5)
CO2: 22 mEq/L (ref 19–32)
CREATININE: 0.87 mg/dL (ref 0.50–1.35)
Chloride: 98 mEq/L (ref 96–112)
GFR, EST NON AFRICAN AMERICAN: 88 mL/min — AB (ref >90)
GLUCOSE: 120 mg/dL — AB (ref 70–99)
Potassium: 4 mEq/L (ref 3.7–5.3)
Sodium: 137 mEq/L (ref 137–147)
Total Bilirubin: 0.7 mg/dL (ref 0.3–1.2)
Total Protein: 6.9 g/dL (ref 6.0–8.3)

## 2013-08-22 LAB — TROPONIN I: Troponin I: <0.3 ng/mL (ref <0.30)

## 2013-08-22 MED ORDER — SODIUM CHLORIDE 0.9 % IV SOLN: INTRAVENOUS | Status: DC

## 2013-08-22 MED ORDER — SODIUM CHLORIDE 0.9 % IV BOLUS (SEPSIS)
1000.0000 mL | Freq: Once | INTRAVENOUS | Status: AC
  Administered 2013-08-22: 1000 mL via INTRAVENOUS

## 2013-08-22 NOTE — ED Notes (Signed)
Pt states he could feel his heart beating fast in his chest. Pt states I think it was 110-120 (HR, per pt). Denies pain or any other symptoms at this time.

## 2013-08-22 NOTE — ED Notes (Signed)
MD at bedside. 
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
## 2013-08-22 NOTE — Discharge Instructions (Signed)
Palpitations   A palpitation is the feeling that your heartbeat is irregular or is faster than normal. It may feel like your heart is fluttering or skipping a beat. Palpitations are usually not a serious problem. However, in some cases, you may need further medical evaluation.  CAUSES   Palpitations can be caused by:   Smoking.   Caffeine or other stimulants, such as diet pills or energy drinks.   Alcohol.   Stress and anxiety.   Strenuous physical activity.   Fatigue.   Certain medicines.   Heart disease, especially if you have a history of arrhythmias. This includes atrial fibrillation, atrial flutter, or supraventricular tachycardia.   An improperly working pacemaker or defibrillator.  DIAGNOSIS   To find the cause of your palpitations, your caregiver will take your history and perform a physical exam. Tests may also be done, including:   Electrocardiography (ECG). This test records the heart's electrical activity.   Cardiac monitoring. This allows your caregiver to monitor your heart rate and rhythm in real time.   Holter monitor. This is a portable device that records your heartbeat and can help diagnose heart arrhythmias. It allows your caregiver to track your heart activity for several days, if needed.   Stress tests by exercise or by giving medicine that makes the heart beat faster.  TREATMENT   Treatment of palpitations depends on the cause of your symptoms and can vary greatly. Most cases of palpitations do not require any treatment other than time, relaxation, and monitoring your symptoms. Other causes, such as atrial fibrillation, atrial flutter, or supraventricular tachycardia, usually require further treatment.  HOME CARE INSTRUCTIONS    Avoid:   Caffeinated coffee, tea, soft drinks, diet pills, and energy drinks.   Chocolate.   Alcohol.   Stop smoking if you smoke.   Reduce your stress and anxiety. Things that can help you relax include:   A method that measures bodily functions so  you can learn to control them (biofeedback).   Yoga.   Meditation.   Physical activity such as swimming, jogging, or walking.   Get plenty of rest and sleep.  SEEK MEDICAL CARE IF:    You continue to have a fast or irregular heartbeat beyond 24 hours.   Your palpitations occur more often.  SEEK IMMEDIATE MEDICAL CARE IF:   You develop chest pain or shortness of breath.   You have a severe headache.   You feel dizzy, or you faint.  MAKE SURE YOU:   Understand these instructions.   Will watch your condition.   Will get help right away if you are not doing well or get worse.  Document Released: 03/15/2000 Document Revised: 07/13/2012 Document Reviewed: 05/17/2011  [employer] Patient Information 2014 [employer].

## 2013-09-07 ENCOUNTER — Ambulatory Visit (INDEPENDENT_AMBULATORY_CARE_PROVIDER_SITE_OTHER): Payer: PRIVATE HEALTH INSURANCE | Admitting: Family Medicine

## 2013-09-07 ENCOUNTER — Encounter: Payer: Self-pay | Admitting: Family Medicine

## 2013-09-07 VITALS — BP 110/72 | HR 83 | Resp 16 | Wt 156.0 lb

## 2013-09-07 DIAGNOSIS — R7401 Elevation of levels of liver transaminase levels: Secondary | ICD-10-CM

## 2013-09-07 DIAGNOSIS — M79609 Pain in unspecified limb: Secondary | ICD-10-CM

## 2013-09-07 DIAGNOSIS — F3289 Other specified depressive episodes: Secondary | ICD-10-CM

## 2013-09-07 DIAGNOSIS — R740 Nonspecific elevation of levels of transaminase and lactic acid dehydrogenase [LDH]: Secondary | ICD-10-CM

## 2013-09-07 DIAGNOSIS — R7402 Elevation of levels of lactic acid dehydrogenase (LDH): Secondary | ICD-10-CM

## 2013-09-07 DIAGNOSIS — E785 Hyperlipidemia, unspecified: Secondary | ICD-10-CM

## 2013-09-07 DIAGNOSIS — M79602 Pain in left arm: Secondary | ICD-10-CM

## 2013-09-07 DIAGNOSIS — I10 Essential (primary) hypertension: Secondary | ICD-10-CM

## 2013-09-07 DIAGNOSIS — G4700 Insomnia, unspecified: Secondary | ICD-10-CM

## 2013-09-07 DIAGNOSIS — F1010 Alcohol abuse, uncomplicated: Secondary | ICD-10-CM

## 2013-09-07 DIAGNOSIS — F329 Major depressive disorder, single episode, unspecified: Secondary | ICD-10-CM

## 2013-09-07 DIAGNOSIS — M5412 Radiculopathy, cervical region: Secondary | ICD-10-CM

## 2013-09-07 MED ORDER — PREDNISONE 5 MG PO KIT: PACK | ORAL | Status: DC

## 2013-09-07 MED ORDER — GABAPENTIN 300 MG PO CAPS: 300.0000 mg | ORAL_CAPSULE | Freq: Every day | ORAL | Status: DC
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
## 2013-09-07 NOTE — Patient Instructions (Addendum)
Annual physical exam in 3.5 month, call if you ned me before  STOP atorvastatin due to high lever enzymes  Hepatic panel draw July 1 please non fast  For nerve pain in  Left upper arm, Ibuprofen 800mg  one twice daily for 1 week , you already have this  Prednisone for 6 days and gabapentin 300mg  one at bedtime for 2 month

## 2013-09-13 ENCOUNTER — Emergency Department (HOSPITAL_COMMUNITY)
Admission: EM | Admit: 2013-09-13 | Discharge: 2013-09-13 | Disposition: A | Discharge status: Home / Self Care | Payer: PRIVATE HEALTH INSURANCE | Attending: Emergency Medicine | Admitting: Emergency Medicine

## 2013-09-13 ENCOUNTER — Encounter (HOSPITAL_COMMUNITY): Payer: Self-pay | Admitting: Emergency Medicine

## 2013-09-13 DIAGNOSIS — Z79899 Other long term (current) drug therapy: Secondary | ICD-10-CM | POA: Insufficient documentation

## 2013-09-13 DIAGNOSIS — F3289 Other specified depressive episodes: Secondary | ICD-10-CM | POA: Insufficient documentation

## 2013-09-13 DIAGNOSIS — E785 Hyperlipidemia, unspecified: Secondary | ICD-10-CM | POA: Insufficient documentation

## 2013-09-13 DIAGNOSIS — Z87442 Personal history of urinary calculi: Secondary | ICD-10-CM | POA: Insufficient documentation

## 2013-09-13 DIAGNOSIS — Z7982 Long term (current) use of aspirin: Secondary | ICD-10-CM | POA: Insufficient documentation

## 2013-09-13 DIAGNOSIS — E559 Vitamin D deficiency, unspecified: Secondary | ICD-10-CM | POA: Insufficient documentation

## 2013-09-13 DIAGNOSIS — F0390 Unspecified dementia without behavioral disturbance: Secondary | ICD-10-CM | POA: Insufficient documentation

## 2013-09-13 DIAGNOSIS — M79609 Pain in unspecified limb: Secondary | ICD-10-CM | POA: Insufficient documentation

## 2013-09-13 DIAGNOSIS — F411 Generalized anxiety disorder: Secondary | ICD-10-CM | POA: Insufficient documentation

## 2013-09-13 DIAGNOSIS — I10 Essential (primary) hypertension: Secondary | ICD-10-CM | POA: Insufficient documentation

## 2013-09-13 DIAGNOSIS — Z8701 Personal history of pneumonia (recurrent): Secondary | ICD-10-CM | POA: Insufficient documentation

## 2013-09-13 DIAGNOSIS — Z8709 Personal history of other diseases of the respiratory system: Secondary | ICD-10-CM | POA: Insufficient documentation

## 2013-09-13 DIAGNOSIS — IMO0002 Reserved for concepts with insufficient information to code with codable children: Secondary | ICD-10-CM | POA: Insufficient documentation

## 2013-09-13 DIAGNOSIS — M129 Arthropathy, unspecified: Secondary | ICD-10-CM | POA: Insufficient documentation

## 2013-09-13 DIAGNOSIS — M79606 Pain in leg, unspecified: Secondary | ICD-10-CM

## 2013-09-13 DIAGNOSIS — F329 Major depressive disorder, single episode, unspecified: Secondary | ICD-10-CM | POA: Insufficient documentation

## 2013-09-13 DIAGNOSIS — Z9889 Other specified postprocedural states: Secondary | ICD-10-CM | POA: Insufficient documentation
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
## 2013-09-13 NOTE — Discharge Instructions (Signed)
Follow up with dr. Hilda LiasKeeling.  Keep leg elevated

## 2013-09-13 NOTE — ED Notes (Signed)
Pt reports 2 weeks ago hit r knee and lower leg on a bench of a picnic table.  Reports had a hematoma for a few days but now has fluid build up on shin.  C/O pain.

## 2013-09-29 LAB — HEPATIC FUNCTION PANEL
ALT: 39 U/L (ref 0–53)
AST: 38 U/L — ABNORMAL HIGH (ref 0–37)
Albumin: 4.2 g/dL (ref 3.5–5.2)
Alkaline Phosphatase: 73 U/L (ref 39–117)
BILIRUBIN DIRECT: 0.2 mg/dL (ref 0.0–0.3)
BILIRUBIN TOTAL: 1.1 mg/dL (ref 0.2–1.2)
Indirect Bilirubin: 0.9 mg/dL (ref 0.2–1.2)
Total Protein: 7 g/dL (ref 6.0–8.3)

## 2013-10-23 ENCOUNTER — Telehealth: Payer: Self-pay | Admitting: Family Medicine

## 2013-10-23 DIAGNOSIS — M5412 Radiculopathy, cervical region: Secondary | ICD-10-CM | POA: Insufficient documentation

## 2013-10-23 DIAGNOSIS — M79602 Pain in left arm: Secondary | ICD-10-CM | POA: Insufficient documentation

## 2013-10-23 NOTE — Telephone Encounter (Signed)
pls see tele msg that was accidentally closed sent on 7/25, should have been routed to you, pt needs to be contacted, thanks ?? pls ask

## 2013-10-23 NOTE — Assessment & Plan Note (Signed)
Marked elevation in liver enzymes, likely due to alcohol use whiuc pt continues to deny an excess of. Hold statin until corrected Hyperlipidemia:Low fat diet discussed and encouraged.

## 2013-10-23 NOTE — Assessment & Plan Note (Signed)
Pt continues  To deny excessive use however has come into the office clearly intoxicated on previous occasion and now has markedly e;levated liver enzymes. Still in denial of this being a problem at this visit

## 2013-10-23 NOTE — Assessment & Plan Note (Signed)
Acute pain flare x 2 weeks, steroids ,then short term gabapentin

## 2013-10-23 NOTE — Telephone Encounter (Signed)
Pls contact pt, let him know that since rept lFT  Early July was normal, he needs to resume the lipto which had been stopped at his June vist. Also I hope that his pain has resolved and he is overall doing better

## 2013-10-23 NOTE — Assessment & Plan Note (Signed)
Controlled on current med,sleep hygiene reviewed, pt compliant

## 2013-10-23 NOTE — Assessment & Plan Note (Signed)
Controlled, no change in medication.not suicidal or homicidal, still isolated from his family unfortunately

## 2013-10-23 NOTE — Assessment & Plan Note (Signed)
Controlled, no change in medication DASH diet and commitment to daily physical activity for a minimum of 30 minutes discussed and encouraged, as a part of hypertension management. The importance of attaining a healthy weight is also discussed.  

## 2013-10-23 NOTE — Assessment & Plan Note (Signed)
Increased and uncontrolled left upper extremity pain, prednisone x 6days and bed time gabapentin, call back if no better

## 2013-10-25 NOTE — Telephone Encounter (Signed)
Called patient and left message for them to return call at the office   

## 2013-10-27 ENCOUNTER — Other Ambulatory Visit: Payer: Self-pay

## 2013-10-27 MED ORDER — ATORVASTATIN CALCIUM 40 MG PO TABS: 40.0000 mg | ORAL_TABLET | Freq: Every day | ORAL | Status: DC

## 2013-10-27 NOTE — Telephone Encounter (Signed)
Patient aware and will resume cholesterol med. Refills sent to the pharmacy

## 2013-11-05 ENCOUNTER — Encounter (HOSPITAL_COMMUNITY): Payer: Self-pay | Admitting: Emergency Medicine

## 2013-11-05 ENCOUNTER — Emergency Department (HOSPITAL_COMMUNITY)
Admission: EM | Admit: 2013-11-05 | Discharge: 2013-11-05 | Disposition: A | Discharge status: Home / Self Care | Payer: PRIVATE HEALTH INSURANCE | Attending: Emergency Medicine | Admitting: Emergency Medicine

## 2013-11-05 DIAGNOSIS — Z7982 Long term (current) use of aspirin: Secondary | ICD-10-CM | POA: Diagnosis not present

## 2013-11-05 DIAGNOSIS — Z87891 Personal history of nicotine dependence: Secondary | ICD-10-CM | POA: Insufficient documentation

## 2013-11-05 DIAGNOSIS — M545 Low back pain, unspecified: Secondary | ICD-10-CM | POA: Insufficient documentation

## 2013-11-05 DIAGNOSIS — F411 Generalized anxiety disorder: Secondary | ICD-10-CM | POA: Diagnosis not present

## 2013-11-05 DIAGNOSIS — I10 Essential (primary) hypertension: Secondary | ICD-10-CM | POA: Insufficient documentation

## 2013-11-05 DIAGNOSIS — M129 Arthropathy, unspecified: Secondary | ICD-10-CM | POA: Insufficient documentation

## 2013-11-05 DIAGNOSIS — Z8701 Personal history of pneumonia (recurrent): Secondary | ICD-10-CM | POA: Insufficient documentation

## 2013-11-05 DIAGNOSIS — F3289 Other specified depressive episodes: Secondary | ICD-10-CM | POA: Insufficient documentation

## 2013-11-05 DIAGNOSIS — G43909 Migraine, unspecified, not intractable, without status migrainosus: Secondary | ICD-10-CM | POA: Diagnosis not present

## 2013-11-05 DIAGNOSIS — E785 Hyperlipidemia, unspecified: Secondary | ICD-10-CM | POA: Insufficient documentation

## 2013-11-05 DIAGNOSIS — F329 Major depressive disorder, single episode, unspecified: Secondary | ICD-10-CM | POA: Diagnosis not present

## 2013-11-05 DIAGNOSIS — IMO0002 Reserved for concepts with insufficient information to code with codable children: Secondary | ICD-10-CM | POA: Insufficient documentation

## 2013-11-05 DIAGNOSIS — F0390 Unspecified dementia without behavioral disturbance: Secondary | ICD-10-CM | POA: Diagnosis not present

## 2013-11-05 DIAGNOSIS — M549 Dorsalgia, unspecified: Secondary | ICD-10-CM | POA: Insufficient documentation

## 2013-11-05 DIAGNOSIS — Z79899 Other long term (current) drug therapy: Secondary | ICD-10-CM | POA: Diagnosis not present

## 2013-11-05 MED ORDER — HYDROCODONE-ACETAMINOPHEN 5-325 MG PO TABS: 1.0000 | ORAL_TABLET | ORAL | Status: DC | PRN

## 2013-11-05 MED ORDER — PREDNISONE 20 MG PO TABS
40.0000 mg | ORAL_TABLET | Freq: Once | ORAL | Status: AC
  Administered 2013-11-05: 40 mg via ORAL
  Filled 2013-11-05: qty 2

## 2013-11-05 NOTE — Discharge Instructions (Signed)
Back Pain, Adult °Low back pain is very common. About 1 in 5 people have back pain. The cause of low back pain is rarely dangerous. The pain often gets better over time. About half of people with a sudden onset of back pain feel better in just 2 weeks. About 8 in 10 people feel better by 6 weeks.  °CAUSES °Some common causes of back pain include: °· Strain of the muscles or ligaments supporting the spine. °· Wear and tear (degeneration) of the spinal discs. °· Arthritis. °· Direct injury to the back. °DIAGNOSIS °Most of the time, the direct cause of low back pain is not known. However, back pain can be treated effectively even when the exact cause of the pain is unknown. Answering your caregiver's questions about your overall health and symptoms is one of the most accurate ways to make sure the cause of your pain is not dangerous. If your caregiver needs more information, he or she may order lab work or imaging tests (X-rays or MRIs). However, even if imaging tests show changes in your back, this usually does not require surgery. °HOME CARE INSTRUCTIONS °For many people, back pain returns. Since low back pain is rarely dangerous, it is often a condition that people can learn to manage on their own.  °· Remain active. It is stressful on the back to sit or stand in one place. Do not sit, drive, or stand in one place for more than 30 minutes at a time. Take short walks on level surfaces as soon as pain allows. Try to increase the length of time you walk each day. °· Do not stay in bed. Resting more than 1 or 2 days can delay your recovery. °· Do not avoid exercise or work. Your body is made to move. It is not dangerous to be active, even though your back may hurt. Your back will likely heal faster if you return to being active before your pain is gone. °· Pay attention to your body when you  bend and lift. Many people have less discomfort when lifting if they bend their knees, keep the load close to their bodies, and  avoid twisting. Often, the most comfortable positions are those that put less stress on your recovering back. °· Find a comfortable position to sleep. Use a firm mattress and lie on your side with your knees slightly bent. If you lie on your back, put a pillow under your knees. °· Only take over-the-counter or prescription medicines as directed by your caregiver. Over-the-counter medicines to reduce pain and inflammation are often the most helpful. Your caregiver may prescribe muscle relaxant drugs. These medicines help dull your pain so you can more quickly return to your normal activities and healthy exercise. °· Put ice on the injured area. °¨ Put ice in a plastic bag. °¨ Place a towel between your skin and the bag. °¨ Leave the ice on for 15-20 minutes, 03-04 times a day for the first 2 to 3 days. After that, ice and heat may be alternated to reduce pain and spasms. °· Ask your caregiver about trying back exercises and gentle massage. This may be of some benefit. °· Avoid feeling anxious or stressed. Stress increases muscle tension and can worsen back pain. It is important to recognize when you are anxious or stressed and learn ways to manage it. Exercise is a great option. °SEEK MEDICAL CARE IF: °· You have pain that is not relieved with rest or medicine. °· You have pain that does not improve in 1 week. °· You have new symptoms. °· You are generally not feeling well. °SEEK   IMMEDIATE MEDICAL CARE IF:  °· You have pain that radiates from your back into your legs. °· You develop new bowel or bladder control problems. °· You have unusual weakness or numbness in your arms or legs. °· You develop nausea or vomiting. °· You develop abdominal pain. °· You feel faint. °Document Released: 03/18/2005 Document Revised: 09/17/2011 Document Reviewed: 07/20/2013 °[employer]® Patient Information ©2015 [employer]. This information is not intended to replace advice given to you by your health care provider. Make sure you  discuss any questions you have with your health care provider. ° °Back Exercises °Back exercises help treat and prevent back injuries. The goal of back exercises is to increase the strength of your abdominal and back muscles and the flexibility of your back. These exercises should be started when you no longer have back pain. Back exercises include: °· Pelvic Tilt. Lie on your back with your knees bent. Tilt your pelvis until the lower part of your back is against the floor. Hold this position 5 to 10 sec and repeat 5 to 10 times. °· Knee to Chest. Pull first 1 knee up against your chest and hold for 20 to 30 seconds, repeat this with the other knee, and then both knees. This may be done with the other leg straight or bent, whichever feels better. °· Sit-Ups or Curl-Ups. Bend your knees 90 degrees. Start with tilting your pelvis, and do a partial, slow sit-up, lifting your trunk only 30 to 45 degrees off the floor. Take at least 2 to 3 seconds for each sit-up. Do not do sit-ups with your knees out straight. If partial sit-ups are difficult, simply do the above but with only tightening your abdominal muscles and holding it as directed. °· Hip-Lift. Lie on your back with your knees flexed 90 degrees. Push down with your feet and shoulders as you raise your hips a couple inches off the floor; hold for 10 seconds, repeat 5 to 10 times. °· Back arches. Lie on your stomach, propping yourself up on bent elbows. Slowly press on your hands, causing an arch in your low back. Repeat 3 to 5 times. Any initial stiffness and discomfort should lessen with repetition over time. °· Shoulder-Lifts. Lie face down with arms beside your body. Keep hips and torso pressed to floor as you slowly lift your head and shoulders off the floor. °Do not overdo your exercises, especially in the beginning. Exercises may cause you some mild back discomfort which lasts for a few minutes; however, if the pain is more severe, or lasts for more than 15  minutes, do not continue exercises until you see your caregiver. Improvement with exercise therapy for back problems is slow.  °See your caregivers for assistance with developing a proper back exercise program. °Document Released: 04/25/2004 Document Revised: 06/10/2011 Document Reviewed: 01/17/2011 °[employer]® Patient Information ©2015 [employer]. This information is not intended to replace advice given to you by your health care provider. Make sure you discuss any questions you have with your health care provider. ° °
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
## 2013-11-05 NOTE — ED Notes (Signed)
Having mid back pain about one hour ago.  Rates pain 7-8.  Have not taken any medication for pain.

## 2013-12-20 ENCOUNTER — Other Ambulatory Visit: Payer: Self-pay | Admitting: Family Medicine

## 2014-03-03 ENCOUNTER — Encounter (HOSPITAL_COMMUNITY): Payer: Self-pay

## 2014-03-03 ENCOUNTER — Emergency Department (HOSPITAL_COMMUNITY)
Admission: EM | Admit: 2014-03-03 | Discharge: 2014-03-03 | Disposition: A | Discharge status: Home / Self Care | Payer: PRIVATE HEALTH INSURANCE | Attending: Emergency Medicine | Admitting: Emergency Medicine

## 2014-03-03 ENCOUNTER — Emergency Department (HOSPITAL_COMMUNITY): Payer: PRIVATE HEALTH INSURANCE

## 2014-03-03 DIAGNOSIS — M1990 Unspecified osteoarthritis, unspecified site: Secondary | ICD-10-CM | POA: Diagnosis not present

## 2014-03-03 DIAGNOSIS — J449 Chronic obstructive pulmonary disease, unspecified: Secondary | ICD-10-CM | POA: Insufficient documentation

## 2014-03-03 DIAGNOSIS — F329 Major depressive disorder, single episode, unspecified: Secondary | ICD-10-CM | POA: Diagnosis not present

## 2014-03-03 DIAGNOSIS — Z7952 Long term (current) use of systemic steroids: Secondary | ICD-10-CM | POA: Diagnosis not present

## 2014-03-03 DIAGNOSIS — Z8701 Personal history of pneumonia (recurrent): Secondary | ICD-10-CM | POA: Diagnosis not present

## 2014-03-03 DIAGNOSIS — Z79899 Other long term (current) drug therapy: Secondary | ICD-10-CM | POA: Insufficient documentation

## 2014-03-03 DIAGNOSIS — F0390 Unspecified dementia without behavioral disturbance: Secondary | ICD-10-CM | POA: Insufficient documentation

## 2014-03-03 DIAGNOSIS — F419 Anxiety disorder, unspecified: Secondary | ICD-10-CM | POA: Diagnosis not present

## 2014-03-03 DIAGNOSIS — Z87820 Personal history of traumatic brain injury: Secondary | ICD-10-CM | POA: Diagnosis not present

## 2014-03-03 DIAGNOSIS — Z7982 Long term (current) use of aspirin: Secondary | ICD-10-CM | POA: Insufficient documentation

## 2014-03-03 DIAGNOSIS — R0602 Shortness of breath: Secondary | ICD-10-CM

## 2014-03-03 DIAGNOSIS — E785 Hyperlipidemia, unspecified: Secondary | ICD-10-CM | POA: Insufficient documentation

## 2014-03-03 DIAGNOSIS — R0981 Nasal congestion: Secondary | ICD-10-CM | POA: Diagnosis present

## 2014-03-03 DIAGNOSIS — Z9889 Other specified postprocedural states: Secondary | ICD-10-CM | POA: Diagnosis not present

## 2014-03-03 DIAGNOSIS — J069 Acute upper respiratory infection, unspecified: Secondary | ICD-10-CM | POA: Insufficient documentation

## 2014-03-03 DIAGNOSIS — R609 Edema, unspecified: Secondary | ICD-10-CM | POA: Insufficient documentation

## 2014-03-03 DIAGNOSIS — I10 Essential (primary) hypertension: Secondary | ICD-10-CM | POA: Diagnosis not present

## 2014-03-03 DIAGNOSIS — Z87891 Personal history of nicotine dependence: Secondary | ICD-10-CM | POA: Diagnosis not present

## 2014-03-03 MED ORDER — PREDNISONE 50 MG PO TABS
60.0000 mg | ORAL_TABLET | Freq: Once | ORAL | Status: AC
  Administered 2014-03-03: 60 mg via ORAL
  Filled 2014-03-03 (×2): qty 1

## 2014-03-03 MED ORDER — ALBUTEROL SULFATE (2.5 MG/3ML) 0.083% IN NEBU
2.5000 mg | INHALATION_SOLUTION | Freq: Once | RESPIRATORY_TRACT | Status: AC
  Administered 2014-03-03: 2.5 mg via RESPIRATORY_TRACT
  Filled 2014-03-03: qty 3

## 2014-03-03 MED ORDER — ALBUTEROL SULFATE HFA 108 (90 BASE) MCG/ACT IN AERS
2.0000 | INHALATION_SPRAY | RESPIRATORY_TRACT | Status: DC | PRN
  Administered 2014-03-03: 2 via RESPIRATORY_TRACT
  Filled 2014-03-03: qty 6.7

## 2014-03-03 MED ORDER — GUAIFENESIN-DM 100-10 MG/5ML PO SYRP: 5.0000 mL | ORAL_SOLUTION | ORAL | Status: DC | PRN

## 2014-03-03 MED ORDER — IPRATROPIUM-ALBUTEROL 0.5-2.5 (3) MG/3ML IN SOLN
3.0000 mL | Freq: Once | RESPIRATORY_TRACT | Status: AC
  Administered 2014-03-03: 3 mL via RESPIRATORY_TRACT
  Filled 2014-03-03: qty 3

## 2014-03-03 MED ORDER — PREDNISONE 20 MG PO TABS: 40.0000 mg | ORAL_TABLET | Freq: Every day | ORAL | Status: DC

## 2014-03-03 MED ORDER — IPRATROPIUM BROMIDE 0.02 % IN SOLN: 0.5000 mg | Freq: Once | RESPIRATORY_TRACT | Status: DC

## 2014-03-03 MED ORDER — ALBUTEROL SULFATE (2.5 MG/3ML) 0.083% IN NEBU: 5.0000 mg | INHALATION_SOLUTION | Freq: Once | RESPIRATORY_TRACT | Status: DC

## 2014-03-03 NOTE — ED Notes (Signed)
Pt walked around nurses station with pulse ox. Pt  Stated 94% but labored breathing noted.

## 2014-03-03 NOTE — ED Notes (Signed)
Having a lot of nasal congestion and it is hard to breath per pt. Started with my ear popping per pt.

## 2014-03-03 NOTE — Discharge Instructions (Signed)
Upper Respiratory Infection, Adult  An upper respiratory infection (URI) is also sometimes known as the common cold. The upper respiratory tract includes the nose, sinuses, throat, trachea, and bronchi. Bronchi are the airways leading to the lungs. Most people improve within 1 week, but symptoms can last up to 2 weeks. A residual cough may last even longer.   CAUSES  Many different viruses can infect the tissues lining the upper respiratory tract. The tissues become irritated and inflamed and often become very moist. Mucus production is also common. A cold is contagious. You can easily spread the virus to others by oral contact. This includes kissing, sharing a glass, coughing, or sneezing. Touching your mouth or nose and then touching a surface, which is then touched by another person, can also spread the virus.  SYMPTOMS   Symptoms typically develop 1 to 3 days after you come in contact with a cold virus. Symptoms vary from person to person. They may include:   Runny nose.   Sneezing.   Nasal congestion.   Sinus irritation.   Sore throat.   Loss of voice (laryngitis).   Cough.   Fatigue.   Muscle aches.   Loss of appetite.   Headache.   Low-grade fever.  DIAGNOSIS   You might diagnose your own cold based on familiar symptoms, since most people get a cold 2 to 3 times a year. Your caregiver can confirm this based on your exam. Most importantly, your caregiver can check that your symptoms are not due to another disease such as strep throat, sinusitis, pneumonia, asthma, or epiglottitis. Blood tests, throat tests, and X-rays are not necessary to diagnose a common cold, but they may sometimes be helpful in excluding other more serious diseases. Your caregiver will decide if any further tests are required.  RISKS AND COMPLICATIONS   You may be at risk for a more severe case of the common cold if you smoke cigarettes, have chronic heart disease (such as heart failure) or lung disease (such as asthma), or if  you have a weakened immune system. The very young and very old are also at risk for more serious infections. Bacterial sinusitis, middle ear infections, and bacterial pneumonia can complicate the common cold. The common cold can worsen asthma and chronic obstructive pulmonary disease (COPD). Sometimes, these complications can require emergency medical care and may be life-threatening.  PREVENTION   The best way to protect against getting a cold is to practice good hygiene. Avoid oral or hand contact with people with cold symptoms. Wash your hands often if contact occurs. There is no clear evidence that vitamin C, vitamin E, echinacea, or exercise reduces the chance of developing a cold. However, it is always recommended to get plenty of rest and practice good nutrition.  TREATMENT   Treatment is directed at relieving symptoms. There is no cure. Antibiotics are not effective, because the infection is caused by a virus, not by bacteria. Treatment may include:   Increased fluid intake. Sports drinks offer valuable electrolytes, sugars, and fluids.   Breathing heated mist or steam (vaporizer or shower).   Eating chicken soup or other clear broths, and maintaining good nutrition.   Getting plenty of rest.   Using gargles or lozenges for comfort.   Controlling fevers with ibuprofen or acetaminophen as directed by your caregiver.   Increasing usage of your inhaler if you have asthma.  Zinc gel and zinc lozenges, taken in the first 24 hours of the common cold, can shorten the   duration and lessen the severity of symptoms. Pain medicines may help with fever, muscle aches, and throat pain. A variety of non-prescription medicines are available to treat congestion and runny nose. Your caregiver can make recommendations and may suggest nasal or lung inhalers for other symptoms.   HOME CARE INSTRUCTIONS    Only take over-the-counter or prescription medicines for pain, discomfort, or fever as directed by your  caregiver.   Use a warm mist humidifier or inhale steam from a shower to increase air moisture. This may keep secretions moist and make it easier to breathe.   Drink enough water and fluids to keep your urine clear or pale yellow.   Rest as needed.   Return to work when your temperature has returned to normal or as your caregiver advises. You may need to stay home longer to avoid infecting others. You can also use a face mask and careful hand washing to prevent spread of the virus.  SEEK MEDICAL CARE IF:    After the first few days, you feel you are getting worse rather than better.   You need your caregiver's advice about medicines to control symptoms.   You develop chills, worsening shortness of breath, or brown or red sputum. These may be signs of pneumonia.   You develop yellow or brown nasal discharge or pain in the face, especially when you bend forward. These may be signs of sinusitis.   You develop a fever, swollen neck glands, pain with swallowing, or white areas in the back of your throat. These may be signs of strep throat.  SEEK IMMEDIATE MEDICAL CARE IF:    You have a fever.   You develop severe or persistent headache, ear pain, sinus pain, or chest pain.   You develop wheezing, a prolonged cough, cough up blood, or have a change in your usual mucus (if you have chronic lung disease).   You develop sore muscles or a stiff neck.  Document Released: 09/11/2000 Document Revised: 06/10/2011 Document Reviewed: 06/23/2013  ExitCare Patient Information 2015 [employer]. This information is not intended to replace advice given to you by your health care provider. Make sure you discuss any questions you have with your health care provider.  Chronic Obstructive Pulmonary Disease  Chronic obstructive pulmonary disease (COPD) is a common lung condition in which airflow from the lungs is limited. COPD is a general term that can be used to describe many different lung problems that limit airflow,  including both chronic bronchitis and emphysema. If you have COPD, your lung function will probably never return to normal, but there are measures you can take to improve lung function and make yourself feel better.   CAUSES    Smoking (common).    Exposure to secondhand smoke.    Genetic problems.   Chronic inflammatory lung diseases or recurrent infections.  SYMPTOMS    Shortness of breath, especially with physical activity.    Deep, persistent (chronic) cough with a large amount of thick mucus.    Wheezing.    Rapid breaths (tachypnea).    Gray or bluish discoloration (cyanosis) of the skin, especially in fingers, toes, or lips.    Fatigue.    Weight loss.    Frequent infections or episodes when breathing symptoms become much worse (exacerbations).    Chest tightness.  DIAGNOSIS   Your health care provider will take a medical history and perform a physical examination to make the initial diagnosis. Additional tests for COPD may include:      Lung (pulmonary) function tests.   Chest X-ray.   CT scan.   Blood tests.  TREATMENT   Treatment available to help you feel better when you have COPD includes:    Inhaler and nebulizer medicines. These help manage the symptoms of COPD and make your breathing more comfortable.   Supplemental oxygen. Supplemental oxygen is only helpful if you have a low oxygen level in your blood.    Exercise and physical activity. These are beneficial for nearly all people with COPD. Some people may also benefit from a pulmonary rehabilitation program.  HOME CARE INSTRUCTIONS    Take all medicines (inhaled or pills) as directed by your health care provider.   Avoid over-the-counter medicines or cough syrups that dry up your airway (such as antihistamines) and slow down the elimination of secretions unless instructed otherwise by your health care provider.    If you are a smoker, the most important thing that you can do is stop smoking. Continuing to smoke  will cause further lung damage and breathing trouble. Ask your health care provider for help with quitting smoking. He or she can direct you to community resources or hospitals that provide support.   Avoid exposure to irritants such as smoke, chemicals, and fumes that aggravate your breathing.   Use oxygen therapy and pulmonary rehabilitation if directed by your health care provider. If you require home oxygen therapy, ask your health care provider whether you should purchase a pulse oximeter to measure your oxygen level at home.    Avoid contact with individuals who have a contagious illness.   Avoid extreme temperature and humidity changes.   Eat healthy foods. Eating smaller, more frequent meals and resting before meals may help you maintain your strength.   Stay active, but balance activity with periods of rest. Exercise and physical activity will help you maintain your ability to do things you want to do.   Preventing infection and hospitalization is very important when you have COPD. Make sure to receive all the vaccines your health care provider recommends, especially the pneumococcal and influenza vaccines. Ask your health care provider whether you need a pneumonia vaccine.   Learn and use relaxation techniques to manage stress.   Learn and use controlled breathing techniques as directed by your health care provider. Controlled breathing techniques include:    Pursed lip breathing. Start by breathing in (inhaling) through your nose for 1 second. Then, purse your lips as if you were going to whistle and breathe out (exhale) through the pursed lips for 2 seconds.    Diaphragmatic breathing. Start by putting one hand on your abdomen just above your waist. Inhale slowly through your nose. The hand on your abdomen should move out. Then purse your lips and exhale slowly. You should be able to feel the hand on your abdomen moving in as you exhale.    Learn and use controlled coughing to clear mucus  from your lungs. Controlled coughing is a series of short, progressive coughs. The steps of controlled coughing are:   1. Lean your head slightly forward.   2. Breathe in deeply using diaphragmatic breathing.   3. Try to hold your breath for 3 seconds.   4. Keep your mouth slightly open while coughing twice.   5. Spit any mucus out into a tissue.   6. Rest and repeat the steps once or twice as needed.  SEEK MEDICAL CARE IF:    You are coughing up more mucus than usual.      There is a change in the color or thickness of your mucus.    Your breathing is more labored than usual.    Your breathing is faster than usual.   SEEK IMMEDIATE MEDICAL CARE IF:    You have shortness of breath while you are resting.    You have shortness of breath that prevents you from:   Being able to talk.    Performing your usual physical activities.    You have chest pain lasting longer than 5 minutes.    Your skin color is more cyanotic than usual.   You measure low oxygen saturations for longer than 5 minutes with a pulse oximeter.  MAKE SURE YOU:    Understand these instructions.   Will watch your condition.   Will get help right away if you are not doing well or get worse.  Document Released: 12/26/2004 Document Revised: 08/02/2013 Document Reviewed: 11/12/2012  ExitCare Patient Information 2015 [employer]. This information is not intended to replace advice given to you by your health care provider. Make sure you discuss any questions you have with your health care provider.
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
## 2014-04-27 ENCOUNTER — Encounter: Payer: Self-pay | Admitting: Cardiovascular Disease

## 2014-04-27 ENCOUNTER — Ambulatory Visit (INDEPENDENT_AMBULATORY_CARE_PROVIDER_SITE_OTHER): Payer: 59 | Admitting: Cardiovascular Disease

## 2014-04-27 VITALS — BP 126/92 | HR 82 | Ht 66.0 in | Wt 154.0 lb

## 2014-04-27 DIAGNOSIS — F329 Major depressive disorder, single episode, unspecified: Secondary | ICD-10-CM

## 2014-04-27 DIAGNOSIS — E785 Hyperlipidemia, unspecified: Secondary | ICD-10-CM

## 2014-04-27 DIAGNOSIS — F32A Depression, unspecified: Secondary | ICD-10-CM

## 2014-04-27 DIAGNOSIS — I2510 Atherosclerotic heart disease of native coronary artery without angina pectoris: Secondary | ICD-10-CM

## 2014-04-27 DIAGNOSIS — I10 Essential (primary) hypertension: Secondary | ICD-10-CM

## 2014-04-27 MED ORDER — LISINOPRIL 20 MG PO TABS: 20.0000 mg | ORAL_TABLET | Freq: Every day | ORAL | Status: DC

## 2014-04-27 MED ORDER — MIRTAZAPINE 30 MG PO TABS: 30.0000 mg | ORAL_TABLET | Freq: Every day | ORAL | Status: DC

## 2014-04-27 NOTE — Patient Instructions (Signed)
Your physician wants you to follow-up in: 1 year with Dr. Purvis SheffieldKoneswaran. You will receive a reminder letter in the mail two months in advance. If you don't receive a letter, please call our office to schedule the follow-up appointment.  Your physician recommends that you continue on your current medications as directed. Please refer to the Current Medication list given to you today.  I have refilled your Remeron and Lisionopril   Thank you for choosing [facility]!
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
## 2015-01-09 ENCOUNTER — Emergency Department (HOSPITAL_COMMUNITY): Payer: Medicare Other

## 2015-01-09 ENCOUNTER — Encounter (HOSPITAL_COMMUNITY): Payer: Self-pay | Admitting: Emergency Medicine

## 2015-01-09 ENCOUNTER — Emergency Department (HOSPITAL_COMMUNITY)
Admission: EM | Admit: 2015-01-09 | Discharge: 2015-01-09 | Disposition: A | Discharge status: Home / Self Care | Payer: Medicare Other | Attending: Physician Assistant | Admitting: Physician Assistant

## 2015-01-09 DIAGNOSIS — Z8659 Personal history of other mental and behavioral disorders: Secondary | ICD-10-CM | POA: Insufficient documentation

## 2015-01-09 DIAGNOSIS — Z8701 Personal history of pneumonia (recurrent): Secondary | ICD-10-CM | POA: Insufficient documentation

## 2015-01-09 DIAGNOSIS — I10 Essential (primary) hypertension: Secondary | ICD-10-CM | POA: Diagnosis not present

## 2015-01-09 DIAGNOSIS — Z9889 Other specified postprocedural states: Secondary | ICD-10-CM | POA: Diagnosis not present

## 2015-01-09 DIAGNOSIS — R1013 Epigastric pain: Secondary | ICD-10-CM

## 2015-01-09 DIAGNOSIS — Z79899 Other long term (current) drug therapy: Secondary | ICD-10-CM | POA: Diagnosis not present

## 2015-01-09 DIAGNOSIS — R109 Unspecified abdominal pain: Secondary | ICD-10-CM | POA: Insufficient documentation

## 2015-01-09 DIAGNOSIS — Z87820 Personal history of traumatic brain injury: Secondary | ICD-10-CM | POA: Insufficient documentation

## 2015-01-09 DIAGNOSIS — F0390 Unspecified dementia without behavioral disturbance: Secondary | ICD-10-CM | POA: Diagnosis not present

## 2015-01-09 DIAGNOSIS — R1012 Left upper quadrant pain: Secondary | ICD-10-CM | POA: Diagnosis present

## 2015-01-09 DIAGNOSIS — Z87891 Personal history of nicotine dependence: Secondary | ICD-10-CM | POA: Diagnosis not present

## 2015-01-09 DIAGNOSIS — Z8639 Personal history of other endocrine, nutritional and metabolic disease: Secondary | ICD-10-CM | POA: Diagnosis not present

## 2015-01-09 DIAGNOSIS — Z7982 Long term (current) use of aspirin: Secondary | ICD-10-CM | POA: Insufficient documentation

## 2015-01-09 DIAGNOSIS — M1990 Unspecified osteoarthritis, unspecified site: Secondary | ICD-10-CM | POA: Insufficient documentation

## 2015-01-09 LAB — COMPREHENSIVE METABOLIC PANEL
ALT: 32 U/L (ref 17–63)
ANION GAP: 8 (ref 5–15)
AST: 42 U/L — ABNORMAL HIGH (ref 15–41)
Albumin: 3.9 g/dL (ref 3.5–5.0)
Alkaline Phosphatase: 60 U/L (ref 38–126)
BUN: 12 mg/dL (ref 6–20)
CALCIUM: 8.4 mg/dL — AB (ref 8.9–10.3)
CO2: 28 mmol/L (ref 22–32)
Chloride: 99 mmol/L — ABNORMAL LOW (ref 101–111)
Creatinine, Ser: 0.6 mg/dL — ABNORMAL LOW (ref 0.61–1.24)
Glucose, Bld: 111 mg/dL — ABNORMAL HIGH (ref 65–99)
Potassium: 3.4 mmol/L — ABNORMAL LOW (ref 3.5–5.1)
SODIUM: 135 mmol/L (ref 135–145)
Total Bilirubin: 1.4 mg/dL — ABNORMAL HIGH (ref 0.3–1.2)
Total Protein: 6.9 g/dL (ref 6.5–8.1)

## 2015-01-09 LAB — URINALYSIS, ROUTINE W REFLEX MICROSCOPIC
Bilirubin Urine: NEGATIVE
Glucose, UA: NEGATIVE mg/dL
Hgb urine dipstick: NEGATIVE
KETONES UR: NEGATIVE mg/dL
LEUKOCYTES UA: NEGATIVE
Nitrite: NEGATIVE
PROTEIN: NEGATIVE mg/dL
Specific Gravity, Urine: 1.01 (ref 1.005–1.030)
UROBILINOGEN UA: 0.2 mg/dL (ref 0.0–1.0)
pH: 6.5 (ref 5.0–8.0)

## 2015-01-09 LAB — CBC
HCT: 44.9 % (ref 39.0–52.0)
HEMOGLOBIN: 15.4 g/dL (ref 13.0–17.0)
MCH: 35.6 pg — AB (ref 26.0–34.0)
MCHC: 34.3 g/dL (ref 30.0–36.0)
MCV: 103.9 fL — ABNORMAL HIGH (ref 78.0–100.0)
Platelets: 212 10*3/uL (ref 150–400)
RBC: 4.32 MIL/uL (ref 4.22–5.81)
RDW: 12.6 % (ref 11.5–15.5)
WBC: 4.5 10*3/uL (ref 4.0–10.5)

## 2015-01-09 LAB — I-STAT TROPONIN, ED: Troponin i, poc: 0.01 ng/mL (ref 0.00–0.08)

## 2015-01-09 LAB — LIPASE, BLOOD: LIPASE: 19 U/L — AB (ref 22–51)

## 2015-01-09 MED ORDER — GI COCKTAIL ~~LOC~~
30.0000 mL | Freq: Once | ORAL | Status: AC
  Administered 2015-01-09: 30 mL via ORAL
  Filled 2015-01-09: qty 30

## 2015-01-09 MED ORDER — OMEPRAZOLE 20 MG PO CPDR: 20.0000 mg | DELAYED_RELEASE_CAPSULE | Freq: Every day | ORAL | Status: DC
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
## 2015-01-09 NOTE — ED Notes (Signed)
PT c/o LUQ abdominal pain but denies any n/v/d and states normal BM 01/08/15. PT states some generalized weakness and states he become dizzy while working in his kitchen around an hour ago but denies any at this time.

## 2015-01-09 NOTE — ED Notes (Signed)
Pt offered something to drink and peanut butter crackers. Pt refused

## 2015-01-09 NOTE — Discharge Instructions (Signed)
Your pain could be from peptic ulcer disease or gastritis. Please eat bland foods. You can take omeprazole for 2 weeks and follow-up with your regular physician. Please come back if the pain returns. We do not think it's your heart at this time but you should return for further evaluation if you're pain returns.   Abdominal Pain, Adult Many things can cause belly (abdominal) pain. Most times, the belly pain is not dangerous. Many cases of belly pain can be watched and treated at home. HOME CARE   Do not take medicines that help you go poop (laxatives) unless told to by your doctor.  Only take medicine as told by your doctor.  Eat or drink as told by your doctor. Your doctor will tell you if you should be on a special diet. GET HELP IF:  You do not know what is causing your belly pain.  You have belly pain while you are sick to your stomach (nauseous) or have runny poop (diarrhea).  You have pain while you pee or poop.  Your belly pain wakes you up at night.  You have belly pain that gets worse or better when you eat.  You have belly pain that gets worse when you eat fatty foods.  You have a fever. GET HELP RIGHT AWAY IF:   The pain does not go away within 2 hours.  You keep throwing up (vomiting).  The pain changes and is only in the right or left part of the belly.  You have bloody or tarry looking poop. MAKE SURE YOU:   Understand these instructions.  Will watch your condition.  Will get help right away if you are not doing well or get worse.   This information is not intended to replace advice given to you by your health care provider. Make sure you discuss any questions you have with your health care provider.   Document Released: 09/04/2007 Document Revised: 04/08/2014 Document Reviewed: 11/25/2012 Elsevier Interactive Patient Education [employer]

## 2015-01-15 ENCOUNTER — Telehealth: Payer: Self-pay | Admitting: Family Medicine

## 2015-01-15 NOTE — Telephone Encounter (Signed)
Pls try and reach out to pt, not seen since 04/2013?? Has had several ED visits, sched if he agrees f/u in next 1 to 2 weeks pls

## 2015-01-16 NOTE — Telephone Encounter (Signed)
Patient states that he does not want to schedule a followup up this time.

## 2015-01-22 ENCOUNTER — Encounter (HOSPITAL_COMMUNITY): Payer: Self-pay | Admitting: *Deleted

## 2015-01-22 ENCOUNTER — Emergency Department (HOSPITAL_COMMUNITY)
Admission: EM | Admit: 2015-01-22 | Discharge: 2015-01-22 | Disposition: A | Discharge status: Home / Self Care | Payer: Medicare Other | Attending: Emergency Medicine | Admitting: Emergency Medicine

## 2015-01-22 DIAGNOSIS — R5383 Other fatigue: Secondary | ICD-10-CM | POA: Diagnosis present

## 2015-01-22 DIAGNOSIS — M6281 Muscle weakness (generalized): Secondary | ICD-10-CM | POA: Insufficient documentation

## 2015-01-22 DIAGNOSIS — Z87820 Personal history of traumatic brain injury: Secondary | ICD-10-CM | POA: Insufficient documentation

## 2015-01-22 DIAGNOSIS — Z7982 Long term (current) use of aspirin: Secondary | ICD-10-CM | POA: Insufficient documentation

## 2015-01-22 DIAGNOSIS — R0602 Shortness of breath: Secondary | ICD-10-CM | POA: Insufficient documentation

## 2015-01-22 DIAGNOSIS — Z8639 Personal history of other endocrine, nutritional and metabolic disease: Secondary | ICD-10-CM | POA: Insufficient documentation

## 2015-01-22 DIAGNOSIS — Z87891 Personal history of nicotine dependence: Secondary | ICD-10-CM | POA: Diagnosis not present

## 2015-01-22 DIAGNOSIS — R05 Cough: Secondary | ICD-10-CM | POA: Insufficient documentation

## 2015-01-22 DIAGNOSIS — M1990 Unspecified osteoarthritis, unspecified site: Secondary | ICD-10-CM | POA: Insufficient documentation

## 2015-01-22 DIAGNOSIS — R531 Weakness: Secondary | ICD-10-CM

## 2015-01-22 DIAGNOSIS — F1010 Alcohol abuse, uncomplicated: Secondary | ICD-10-CM | POA: Insufficient documentation

## 2015-01-22 DIAGNOSIS — Z79899 Other long term (current) drug therapy: Secondary | ICD-10-CM | POA: Insufficient documentation

## 2015-01-22 DIAGNOSIS — Z8701 Personal history of pneumonia (recurrent): Secondary | ICD-10-CM | POA: Insufficient documentation

## 2015-01-22 DIAGNOSIS — I10 Essential (primary) hypertension: Secondary | ICD-10-CM | POA: Diagnosis not present

## 2015-01-22 DIAGNOSIS — F0390 Unspecified dementia without behavioral disturbance: Secondary | ICD-10-CM | POA: Insufficient documentation

## 2015-01-22 LAB — URINALYSIS, ROUTINE W REFLEX MICROSCOPIC
BILIRUBIN URINE: NEGATIVE
GLUCOSE, UA: NEGATIVE mg/dL
HGB URINE DIPSTICK: NEGATIVE
KETONES UR: NEGATIVE mg/dL
Leukocytes, UA: NEGATIVE
Nitrite: NEGATIVE
PH: 5.5 (ref 5.0–8.0)
Protein, ur: NEGATIVE mg/dL
Specific Gravity, Urine: 1.02 (ref 1.005–1.030)
Urobilinogen, UA: 0.2 mg/dL (ref 0.0–1.0)

## 2015-01-22 LAB — TROPONIN I: Troponin I: <0.03 ng/mL (ref <0.031)

## 2015-01-22 LAB — CBC WITH DIFFERENTIAL/PLATELET
BASOS ABS: 0.1 10*3/uL (ref 0.0–0.1)
Basophils Relative: 1 %
EOS ABS: 0 10*3/uL (ref 0.0–0.7)
EOS PCT: 1 %
HCT: 46.9 % (ref 39.0–52.0)
Hemoglobin: 16.5 g/dL (ref 13.0–17.0)
LYMPHS ABS: 1.7 10*3/uL (ref 0.7–4.0)
LYMPHS PCT: 32 %
MCH: 35.9 pg — AB (ref 26.0–34.0)
MCHC: 35.2 g/dL (ref 30.0–36.0)
MCV: 102 fL — AB (ref 78.0–100.0)
MONO ABS: 0.7 10*3/uL (ref 0.1–1.0)
Monocytes Relative: 13 %
Neutro Abs: 2.8 10*3/uL (ref 1.7–7.7)
Neutrophils Relative %: 53 %
PLATELETS: 260 10*3/uL (ref 150–400)
RBC: 4.6 MIL/uL (ref 4.22–5.81)
RDW: 13.2 % (ref 11.5–15.5)
WBC: 5.2 10*3/uL (ref 4.0–10.5)

## 2015-01-22 LAB — HEPATIC FUNCTION PANEL
ALK PHOS: 67 U/L (ref 38–126)
ALT: 42 U/L (ref 17–63)
AST: 72 U/L — ABNORMAL HIGH (ref 15–41)
Albumin: 4.1 g/dL (ref 3.5–5.0)
BILIRUBIN DIRECT: 0.3 mg/dL (ref 0.1–0.5)
BILIRUBIN INDIRECT: 1.2 mg/dL — AB (ref 0.3–0.9)
BILIRUBIN TOTAL: 1.5 mg/dL — AB (ref 0.3–1.2)
TOTAL PROTEIN: 7.2 g/dL (ref 6.5–8.1)

## 2015-01-22 LAB — BASIC METABOLIC PANEL
Anion gap: 13 (ref 5–15)
BUN: 7 mg/dL (ref 6–20)
CALCIUM: 9.1 mg/dL (ref 8.9–10.3)
CO2: 25 mmol/L (ref 22–32)
CREATININE: 0.61 mg/dL (ref 0.61–1.24)
Chloride: 98 mmol/L — ABNORMAL LOW (ref 101–111)
GFR calc Af Amer: >60 mL/min (ref >60)
GLUCOSE: 110 mg/dL — AB (ref 65–99)
Potassium: 3.7 mmol/L (ref 3.5–5.1)
SODIUM: 136 mmol/L (ref 135–145)

## 2015-01-22 LAB — ETHANOL: Alcohol, Ethyl (B): 60 mg/dL — ABNORMAL HIGH (ref <5)

## 2015-01-22 LAB — TSH: TSH: 0.992 u[IU]/mL (ref 0.350–4.500)

## 2015-01-22 LAB — LIPASE, BLOOD: Lipase: 26 U/L (ref 11–51)

## 2015-01-22 MED ORDER — ONE-A-DAY MENS PO TABS: 1.0000 | ORAL_TABLET | Freq: Every day | ORAL | Status: AC

## 2015-01-22 MED ORDER — VITAMIN B-1 100 MG PO TABS: 100.0000 mg | ORAL_TABLET | Freq: Every day | ORAL | Status: DC

## 2015-01-22 MED ORDER — SODIUM CHLORIDE 0.9 % IV BOLUS (SEPSIS)
1000.0000 mL | Freq: Once | INTRAVENOUS | Status: AC
  Administered 2015-01-22: 1000 mL via INTRAVENOUS

## 2015-01-22 MED ORDER — VITAMIN B-1 100 MG PO TABS
100.0000 mg | ORAL_TABLET | Freq: Once | ORAL | Status: AC
  Administered 2015-01-22: 100 mg via ORAL
  Filled 2015-01-22 (×2): qty 1

## 2015-01-22 NOTE — ED Notes (Signed)
Patient given diet coke to drink.

## 2015-01-22 NOTE — ED Notes (Signed)
Pt c/o generalized weakness, decreased appetite that started yesterday, denies any fever, chills, pain,

## 2015-01-22 NOTE — ED Provider Notes (Signed)
TIME SEEN: 6:30 AM  CHIEF COMPLAINT: Decreased appetite, generalized weakness  HPI: Pt is a 69 y.o. male with history of atrial fibrillation, hypertension, hyperlipidemia who presents to the emergency department with complaints of several days of decreased appetite that he feels is leading to generalized weakness. States that food just does not taste right to him. Denies any weight loss. No known history of cancer. Denies he has had any fevers, chest pain, nausea, vomiting or diarrhea. No abdominal pain. Has a chronic cough and chronic shortness of breath which are unchanged from a history of smoking for 34 years. Denies numbness, tingling or focal weakness. States he does drink "on the weekends" and had a "lime-a-rita" yesterday. Denies any drug use. Denies that he's ever had this problem before. States his PCP is Dr. Lodema HongSimpson but he has not seen her in a year.  ROS: See HPI Constitutional: no fever  Eyes: no drainage  ENT: no runny nose   Cardiovascular:  no chest pain  Resp: no SOB  GI: no vomiting GU: no dysuria Integumentary: no rash  Allergy: no hives  Musculoskeletal: no leg swelling  Neurological: no slurred speech ROS otherwise negative  PAST MEDICAL HISTORY/PAST SURGICAL HISTORY:  Past Medical History  Diagnosis Date  . Hypertension   . Hyperlipidemia   . Anxiety   . Depression   . Atrial fibrillation [facility](HCC)     patient unaware  . Vitamin D deficiency   . History of migraines   . Arthritis   . Low back pain   . Dementia     with traumatic brain injury  . Allergic rhinitis   . Complication of anesthesia   . Pneumonia   . Anxiety     MEDICATIONS:  Prior to Admission medications   Medication Sig Start Date End Date Taking? Authorizing Provider  aspirin EC 81 MG tablet Take 81 mg by mouth daily.    Historical Provider, MD  guaiFENesin-dextromethorphan (ROBITUSSIN DM) 100-10 MG/5ML syrup Take 5 mLs by mouth every 4 (four) hours as needed for cough. Patient not taking:  Reported on 01/09/2015 03/03/14   Benjiman CoreNathan Pickering, MD  HYDROcodone-acetaminophen [facility](NORCO) 5-325 MG per tablet Take 1 tablet by mouth every 4 (four) hours as needed. Patient not taking: Reported on 01/09/2015 11/05/13   Mancel BaleElliott Wentz, MD  HYDROcodone-acetaminophen [facility](NORCO) 7.5-325 MG tablet Take 1 tablet by mouth every 6 (six) hours as needed. Pain 12/28/14   Historical Provider, MD  lisinopril (PRINIVIL,ZESTRIL) 20 MG tablet Take 1 tablet (20 mg total) by mouth daily. 04/27/14   Laqueta LindenSuresh A Koneswaran, MD  mirtazapine (REMERON) 30 MG tablet Take 1 tablet (30 mg total) by mouth at bedtime. 04/27/14   Laqueta LindenSuresh A Koneswaran, MD  omeprazole (PRILOSEC) 20 MG capsule Take 1 capsule (20 mg total) by mouth daily. 01/09/15   Courteney Lyn Mackuen, MD  tiotropium (SPIRIVA HANDIHALER) 18 MCG inhalation capsule Place 1 capsule (18 mcg total) into inhaler and inhale daily. 01/12/13 01/12/14  Kerri PerchesMargaret E Simpson, MD    ALLERGIES:  Allergies  Allergen Reactions  . Sulfonamide Derivatives Anaphylaxis  . Ambien [Zolpidem Tartrate]     Impaired mental status     SOCIAL HISTORY:  Social History  Substance Use Topics  . Smoking status: Former Smoker -- 40 years    Types: Pipe    Start date: 12/04/1961  . Smokeless tobacco: Never Used  . Alcohol Use: 0.0 oz/week    0 Standard drinks or equivalent per week     Comment: brandy one ounce  nightly    FAMILY HISTORY: Family History  Problem Relation Age of Onset  . Kidney disease Mother   . Heart disease Father   . Diabetes Sister   . Colon cancer Neg Hx   . Liver disease Neg Hx   . Anesthesia problems Neg Hx   . Hypotension Neg Hx   . Malignant hyperthermia Neg Hx   . Pseudochol deficiency Neg Hx     EXAM: BP 176/98 mmHg  Pulse 92  Temp(Src) 98.3 F (36.8 C) (Oral)  Resp 16  Ht 5' 6.5" (1.689 m)  Wt 152 lb (68.947 kg)  BMI 24.17 kg/m2  SpO2 99% CONSTITUTIONAL: Alert and oriented and responds appropriately to questions. Chronically ill-appearing,  appears older than stated age but is in no distress, appears hydrated HEAD: Normocephalic EYES: Conjunctivae clear, PERRL ENT: normal nose; no rhinorrhea; moist mucous membranes; pharynx without lesions noted NECK: Supple, no meningismus, no LAD  CARD: RRR; S1 and S2 appreciated; no murmurs, no clicks, no rubs, no gallops RESP: Normal chest excursion without splinting or tachypnea; breath sounds clear and equal bilaterally; no wheezes, no rhonchi, no rales, no hypoxia or respiratory distress, speaking full sentences ABD/GI: Normal bowel sounds; non-distended; soft, non-tender, no rebound, no guarding, no peritoneal signs BACK:  The back appears normal and is non-tender to palpation, there is no CVA tenderness EXT: Normal ROM in all joints; non-tender to palpation; no edema; normal capillary refill; no cyanosis, no calf tenderness or swelling    SKIN: Normal color for age and race; warm NEURO: Moves all extremities equally, sensation to light touch intact diffusely, cranial nerves II through XII intact, normal gait PSYCH: The patient's mood and manner are appropriate. Grooming and personal hygiene are appropriate.  MEDICAL DECISION MAKING: Patient here with complaints of generalized weakness, decreased appetite. Denies any weight loss. Denies any other associated symptoms. EKG shows no ischemic changes. He is in a normal sinus rhythm. He is hemodynamically stable. We'll obtain basic labs, urinalysis to evaluate for any possible organic cause for patient's symptoms. We'll give IV fluids. His native workup is unremarkable that he will be discharged home with outpatient follow-up.  ED PROGRESS: 8:00 AM  Pt's labs are relatively unremarkable other than alcohol level of 60 and a slightly elevated AST. He now admits to drinking every day. No history of complicated withdrawal. He does appear slightly tremulous on exam currently and is mildly hypertensive. States he is ready to go home. We'll discharge him  with information for rehabilitation. We'll give thiamine. Suspect that his alcohol abuse is the reason he is having decreased appetite and generalized weakness. No anemia, electrolyte abnormality, elevated troponin, UTI. Reports feeling better after IV fluids. Discussed return precautions and supportive care instructions. Have recommended PCP follow-up. He verbalizes understanding and is comfortable with this plan.    EKG Interpretation  Date/Time:  Sunday January 22 2015 06:29:37 EDT Ventricular Rate:  95 PR Interval:  148 QRS Duration: 75 QT Interval:  355 QTC Calculation: 446 R Axis:   50 Text Interpretation:  Sinus rhythm Probable anteroseptal infarct, old Baseline wander in lead(s) III aVF V3 V6 Confirmed by Rene Gonsoulin,  DO, Nyra Anspaugh ([id number]) on 01/22/2015 6:35:53 AM        Layla Maw Galit Urich, DO 01/22/15 6045

## 2015-01-22 NOTE — Discharge Instructions (Signed)
Alcohol Abuse and Nutrition Alcohol abuse is any pattern of alcohol consumption that harms your health, relationships, or work. Alcohol abuse can affect how your body breaks down and absorbs nutrients from food by causing your liver to work abnormally. Additionally, many people who abuse alcohol do not eat enough carbohydrates, protein, fat, vitamins, and minerals. This can cause poor nutrition (malnutrition) and a lack of nutrients (nutrient deficiencies), which can lead to further complications. Nutrients that are commonly lacking (deficient) among people who abuse alcohol include:  Vitamins.  Vitamin A. This is stored in your liver. It is important for your vision, metabolism, and ability to fight off infections (immunity).  B vitamins. These include vitamins such as folate, thiamin, and niacin. These are important in new cell growth and maintenance.  Vitamin C. This plays an important role in iron absorption, wound healing, and immunity.  Vitamin D. This is produced by your liver, but you can also get vitamin D from food. Vitamin D is necessary for your body to absorb and use calcium.  Minerals.  Calcium. This is important for your bones and your heart and blood vessel (cardiovascular) function.  Iron. This is important for blood, muscle, and nervous system functioning.  Magnesium. This plays an important role in muscle and nerve function, and it helps to control blood sugar and blood pressure.  Zinc. This is important for the normal function of your nervous system and digestive system (gastrointestinal tract). Nutrition is an essential component of therapy for alcohol abuse. Your health care provider or dietitian will work with you to design a plan that can help restore nutrients to your body and prevent potential complications. WHAT IS MY PLAN? Your dietitian may develop a specific diet plan that is based on your condition and any other complications you may have. A diet plan will  commonly include:  A balanced diet.  Grains: 6-8 oz per day.  Vegetables: 2-3 cups per day.  Fruits: 1-2 cups per day.  Meat and other protein: 5-6 oz per day.  Dairy: 2-3 cups per day.  Vitamin and mineral supplements. WHAT DO I NEED TO KNOW ABOUT ALCOHOL AND NUTRITION?  Consume foods that are high in antioxidants, such as grapes, berries, nuts, green tea, and dark green and orange vegetables. This can help to counteract some of the stress that is placed on your liver by consuming alcohol.  Avoid food and drinks that are high in fat and sugar. Foods such as sugared soft drinks, salty snack foods, and candy contain empty calories. This means that they lack important nutrients such as protein, fiber, and vitamins.  Eat frequent meals and snacks. Try to eat 5-6 small meals each day.  Eat a variety of fresh fruits and vegetables each day. This will help you get plenty of water, fiber, and vitamins in your diet.  Drink plenty of water and other clear fluids. Try to drink at least 48-64 oz (1.5-2 L) of water per day.  If you are a vegetarian, eat a variety of protein-rich foods. Pair whole grains with plant-based proteins at meals and snacks to obtain the greatest nutrient benefit from your food. For example, eat rice with beans, put peanut butter on whole-grain toast, or eat oatmeal with sunflower seeds.  Soak beans and whole grains overnight before cooking. This can help your body to absorb the nutrients more easily.  Include foods fortified with vitamins and minerals in your diet. Commonly fortified foods include milk, orange juice, cereal, and bread.  If you  are malnourished, your dietitian may recommend a high-protein, high-calorie diet. This may include:  2,000-3,000 calories (kilocalories) per day.  70-100 grams of protein per day.  Your health care provider may recommend a complete nutritional supplement beverage. This can help to restore calories, protein, and vitamins to  your body. Depending on your condition, you may be advised to consume this instead of or in addition to meals.  Limit your intake of caffeine. Replace drinks like coffee and black tea with decaffeinated coffee and herbal tea.  Eat a variety of foods that are high in omega fatty acids. These include fish, nuts and seeds, and soybeans. These foods may help your liver to recover and may also stabilize your mood.  Certain medicines may cause changes in your appetite, taste, and weight. Work with your health care provider and dietitian to make any adjustments to your medicines and diet plan.  Include other healthy lifestyle choices in your daily routine.  Be physically active.  Get enough sleep.  Spend time doing activities that you enjoy.  If you are unable to take in enough food and calories by mouth, your health care provider may recommend a feeding tube. This is a tube that passes through your nose and throat, directly into your stomach. Nutritional supplement beverages can be given to you through the feeding tube to help you get the nutrients you need.  Take vitamin or mineral supplements as recommended by your health care provider. WHAT FOODS CAN I EAT? Grains Enriched pasta. Enriched rice. Fortified whole-grain bread. Fortified whole-grain cereal. Barley. Brown rice. Quinoa. Erwin. Vegetables All fresh, frozen, and canned vegetables. Spinach. Kale. Artichoke. Carrots. Winter squash and pumpkin. Sweet potatoes. Broccoli. Cabbage. Cucumbers. Tomatoes. Sweet peppers. Green beans. Peas. Corn. Fruits All fresh and frozen fruits. Berries. Grapes. Mango. Papaya. Guava. Cherries. Apples. Bananas. Peaches. Plums. Pineapple. Watermelon. Cantaloupe. Oranges. Avocado. Meats and Other Protein Sources Beef liver. Lean beef. Pork. Fresh and canned chicken. Fresh fish. Oysters. Sardines. Canned tuna. Shrimp. Eggs with yolks. Nuts and seeds. Peanut butter. Beans and lentils. Soybeans.  Tofu. Dairy Whole, low-fat, and nonfat milk. Whole, low-fat, and nonfat yogurt. Cottage cheese. Sour cream. Hard and soft cheeses. Beverages Water. Herbal tea. Decaffeinated coffee. Decaffeinated green tea. 100% fruit juice. 100% vegetable juice. Instant breakfast shakes. Condiments Ketchup. Mayonnaise. Mustard. Salad dressing. Barbecue sauce. Sweets and Desserts Sugar-free ice cream. Sugar-free pudding. Sugar-free gelatin. Fats and Oils Butter. Vegetable oil, flaxseed oil, olive oil, and walnut oil. Other Complete nutrition shakes. Protein bars. Sugar-free gum. The items listed above may not be a complete list of recommended foods or beverages. Contact your dietitian for more options. WHAT FOODS ARE NOT RECOMMENDED? Grains Sugar-sweetened breakfast cereals. Flavored instant oatmeal. Fried breads. Vegetables Breaded or deep-fried vegetables. Fruits Dried fruit with added sugar. Candied fruit. Canned fruit in syrup. Meats and Other Protein Sources Breaded or deep-fried meats. Dairy Flavored milks. Fried cheese curds or fried cheese sticks. Beverages Alcohol. Sugar-sweetened soft drinks. Sugar-sweetened tea. Caffeinated coffee and tea. Condiments Sugar. Honey. Agave nectar. Molasses. Sweets and Desserts Chocolate. Cake. Cookies. Candy. Other Potato chips. Pretzels. Salted nuts. Candied nuts. The items listed above may not be a complete list of foods and beverages to avoid. Contact your dietitian for more information.   This information is not intended to replace advice given to you by your health care provider. Make sure you discuss any questions you have with your health care provider.   Document Released: 01/10/2005 Document Revised: 04/08/2014 Document Reviewed: 10/19/2013 Elsevier Interactive Patient  Education 2016 [employer]  Alcohol Use Disorder Alcohol use disorder is a mental disorder. It is not a one-time incident of heavy drinking. Alcohol use disorder is the  excessive and uncontrollable use of alcohol over time that leads to problems with functioning in one or more areas of daily living. People with this disorder risk harming themselves and others when they drink to excess. Alcohol use disorder also can cause other mental disorders, such as mood and anxiety disorders, and serious physical problems. People with alcohol use disorder often misuse other drugs.  Alcohol use disorder is common and widespread. Some people with this disorder drink alcohol to cope with or escape from negative life events. Others drink to relieve chronic pain or symptoms of mental illness. People with a family history of alcohol use disorder are at higher risk of losing control and using alcohol to excess.  Drinking too much alcohol can cause injury, accidents, and health problems. One drink can be too much when you are:  Working.  Pregnant or breastfeeding.  Taking medicines. Ask your doctor.  Driving or planning to drive. SYMPTOMS  Signs and symptoms of alcohol use disorder may include the following:   Consumption ofalcohol inlarger amounts or over a longer period of time than intended.  Multiple unsuccessful attempts to cutdown or control alcohol use.   A great deal of time spent obtaining alcohol, using alcohol, or recovering from the effects of alcohol (hangover).  A strong desire or urge to use alcohol (cravings).   Continued use of alcohol despite problems at work, school, or home because of alcohol use.   Continued use of alcohol despite problems in relationships because of alcohol use.  Continued use of alcohol in situations when it is physically hazardous, such as driving a car.  Continued use of alcohol despite awareness of a physical or psychological problem that is likely related to alcohol use. Physical problems related to alcohol use can involve the brain, heart, liver, stomach, and intestines. Psychological problems related to alcohol use include  intoxication, depression, anxiety, psychosis, delirium, and dementia.   The need for increased amounts of alcohol to achieve the same desired effect, or a decreased effect from the consumption of the same amount of alcohol (tolerance).  Withdrawal symptoms upon reducing or stopping alcohol use, or alcohol use to reduce or avoid withdrawal symptoms. Withdrawal symptoms include:  Racing heart.  Hand tremor.  Difficulty sleeping.  Nausea.  Vomiting.  Hallucinations.  Restlessness.  Seizures. DIAGNOSIS Alcohol use disorder is diagnosed through an assessment by your health care provider. Your health care provider may start by asking three or four questions to screen for excessive or problematic alcohol use. To confirm a diagnosis of alcohol use disorder, at least two symptoms must be present within a 676-month period. The severity of alcohol use disorder depends on the number of symptoms:  Mild--two or three.  Moderate--four or five.  Severe--six or more. Your health care provider may perform a physical exam or use results from lab tests to see if you have physical problems resulting from alcohol use. Your health care provider may refer you to a mental health professional for evaluation. TREATMENT  Some people with alcohol use disorder are able to reduce their alcohol use to low-risk levels. Some people with alcohol use disorder need to quit drinking alcohol. When necessary, mental health professionals with specialized training in substance use treatment can help. Your health care provider can help you decide how severe your alcohol use disorder is and  what type of treatment you need. The following forms of treatment are available:   Detoxification. Detoxification involves the use of prescription medicines to prevent alcohol withdrawal symptoms in the first week after quitting. This is important for people with a history of symptoms of withdrawal and for heavy drinkers who are likely to  have withdrawal symptoms. Alcohol withdrawal can be dangerous and, in severe cases, cause death. Detoxification is usually provided in a hospital or in-patient substance use treatment facility.  Counseling or talk therapy. Talk therapy is provided by substance use treatment counselors. It addresses the reasons people use alcohol and ways to keep them from drinking again. The goals of talk therapy are to help people with alcohol use disorder find healthy activities and ways to cope with life stress, to identify and avoid triggers for alcohol use, and to handle cravings, which can cause relapse.  Medicines.Different medicines can help treat alcohol use disorder through the following actions:  Decrease alcohol cravings.  Decrease the positive reward response felt from alcohol use.  Produce an uncomfortable physical reaction when alcohol is used (aversion therapy).  Support groups. Support groups are run by people who have quit drinking. They provide emotional support, advice, and guidance. These forms of treatment are often combined. Some people with alcohol use disorder benefit from intensive combination treatment provided by specialized substance use treatment centers. Both inpatient and outpatient treatment programs are available.   This information is not intended to replace advice given to you by your health care provider. Make sure you discuss any questions you have with your health care provider.   Document Released: 04/25/2004 Document Revised: 04/08/2014 Document Reviewed: 06/25/2012 Elsevier Interactive Patient Education 2016 Elsevier Inc.  Weakness Weakness is a lack of strength. It may be felt all over the body (generalized) or in one specific part of the body (focal). Some causes of weakness can be serious. You may need further medical evaluation, especially if you are elderly or you have a history of immunosuppression (such as chemotherapy or HIV), kidney disease, heart disease, or  diabetes. CAUSES  Weakness can be caused by many different things, including:  Infection.  Physical exhaustion.  Internal bleeding or other blood loss that results in a lack of red blood cells (anemia).  Dehydration. This cause is more common in elderly people.  Side effects or electrolyte abnormalities from medicines, such as pain medicines or sedatives.  Emotional distress, anxiety, or depression.  Circulation problems, especially severe peripheral arterial disease.  Heart disease, such as rapid atrial fibrillation, bradycardia, or heart failure.  Nervous system disorders, such as Guillain-Barr syndrome, multiple sclerosis, or stroke. DIAGNOSIS  To find the cause of your weakness, your caregiver will take your history and perform a physical exam. Lab tests or X-rays may also be ordered, if needed. TREATMENT  Treatment of weakness depends on the cause of your symptoms and can vary greatly. HOME CARE INSTRUCTIONS   Rest as needed.  Eat a well-balanced diet.  Try to get some exercise every day.  Only take over-the-counter or prescription medicines as directed by your caregiver. SEEK MEDICAL CARE IF:   Your weakness seems to be getting worse or spreads to other parts of your body.  You develop new aches or pains. SEEK IMMEDIATE MEDICAL CARE IF:   You cannot perform your normal daily activities, such as getting dressed and feeding yourself.  You cannot walk up and down stairs, or you feel exhausted when you do so.  You have shortness of breath or  chest pain.  You have difficulty moving parts of your body.  You have weakness in only one area of the body or on only one side of the body.  You have a fever.  You have trouble speaking or swallowing.  You cannot control your bladder or bowel movements.  You have black or bloody vomit or stools. MAKE SURE YOU:  Understand these instructions.  Will watch your condition.  Will get help right away if you are not  doing well or get worse.   This information is not intended to replace advice given to you by your health care provider. Make sure you discuss any questions you have with your health care provider.   Document Released: 03/18/2005 Document Revised: 09/17/2011 Document Reviewed: 05/17/2011 Elsevier Interactive Patient Education 2016 [employer]     Emergency Department Resource Guide 1) Find a Doctor and Pay Out of Pocket Although you won't have to find out who is covered by your insurance plan, it is a good idea to ask around and get recommendations. You will then need to call the office and see if the doctor you have chosen will accept you as a new patient and what types of options they offer for patients who are self-pay. Some doctors offer discounts or will set up payment plans for their patients who do not have insurance, but you will need to ask so you aren't surprised when you get to your appointment.  2) Contact Your Local Health Department Not all health departments have doctors that can see patients for sick visits, but many do, so it is worth a call to see if yours does. If you don't know where your local health department is, you can check in your phone book. The CDC also has a tool to help you locate your state's health department, and many state websites also have listings of all of their local health departments.  3) Find a Walk-in Clinic If your illness is not likely to be very severe or complicated, you may want to try a walk in clinic. These are popping up all over the country in pharmacies, drugstores, and shopping centers. They're usually staffed by nurse practitioners or physician assistants that have been trained to treat common illnesses and complaints. They're usually fairly quick and inexpensive. However, if you have serious medical issues or chronic medical problems, these are probably not your best option.  No Primary Care Doctor: - Call Health Connect at  [phone] -  they can help you locate a primary care doctor that  accepts your insurance, provides certain services, etc. - Physician Referral Service- [phone]  Chronic Pain Problems: Organization         Address  Phone   Notes  [facility]  [phone] Patients need to be referred by their primary care doctor.   Medication Assistance: Organization         Address  Phone   Notes  [facility] [address] [place], [place] [id number] [phone] --Must be a resident of [place] -- Must have NO insurance coverage whatsoever (no Medicaid/ Medicare, etc.) -- The pt. MUST have a primary care doctor that directs their care regularly and follows them in the community   [facility]  [phone]   [employer]  [phone]    Agencies that provide inexpensive medical care: Organization         Address  Phone   Notes  [facility]  [phone]   [facility]    [phone]   [facility] [address] [place], [place] [address] [phone]   [facility] [address], [place] [phone]   [facility]anned [facility]    [phone]   [facility]    [phone]   [facility] and [facility]  [address], [place] Phone:  [phone], Fax:  [phone] Hours of Operation:  9 am - 6 pm, M-F.  Also accepts Medicaid/Medicare and self-pay.  [facility]  [address], [place] Phone: [phone], Fax: [phone]. Hours of Operation:  8:30 am - 5:30 pm, M-F.  Also accepts Medicaid and self-pay.  [place] [address], [place] Phone: [phone]   [facility] [address] Natasha Bence [place], [place] [phone], Ext. [phone] Mondays & Thursdays: 7-9 AM.  First 15 patients are seen on a first come, first serve basis.    Medicaid-accepting  [facility] Providers:  Organization         Address  Phone   Notes  [facility] [address], [place] [phone] Also accepts self-pay patients.  [facility] [address] Laurell Josephs [place], [place]  [phone]   [facility] [address], [place] [phone]   [facility] [address] [place] [phone]   Renaye Rakers [address], [place]   [phone] Only accepts [place] patients after they have their name applied to their card.   Self-Pay (no insurance) in [facility]:  Organization         Address  Phone   Notes  Sickle Cell Patients, [facility] [address] [place], [place] [phone]   [facility] [address] [place], [place] [phone]   [facility]  [address], [place] [phone]   [facility]/Dr. Osei-Bonsu  [address] [place] or [address], [place] [phone] Phone number for both [place] and [place] locations is the same.  [facility] [address], [place] [phone]   [facility] [address] [place] or [address] [phone]   [facility] [address] [place] [phone], phone; [phone], fax Sees patients 1st and 3rd Saturday of every month.  Must not qualify for public or private insurance (i.e. Medicaid, Medicare, [place] Health Choice, Veterans' Benefits)  Household income should be no more than 200% of the poverty level The clinic cannot treat you if you are pregnant or think you are pregnant  Sexually transmitted diseases are not treated at the clinic.    Dental Care: Organization         Address  Phone  Notes  [facility] Department of [facility] [address] [place], [place] [phone] Accepts children up to age 38 who are enrolled in [place] or [place] Health Choice; pregnant women with a Medicaid card; and children who have applied for Medicaid or [place] Health Choice, but were declined, whose parents can pay a reduced fee at time of service.  [facility] Department of [employer]  [place]  [address], [place] [phone] Accepts children up to age 108 who are enrolled in Medicaid or [place] Health Choice; pregnant women with a Medicaid card; and children who have applied for Medicaid or [place] Health Choice, but were declined, whose parents can pay a reduced fee at time of service.  [facility]  [address] [place], [place] [phone] Patients are seen by appointment only. Walk-ins are not accepted. [facility] will see patients 79 years of age and older. Monday - Tuesday (8am-5pm) Most Wednesdays (8:30-5pm) $30 per visit, cash only  [facility]  [address], [facility] [phone] Patients are seen by appointment only. Walk-ins are not accepted. [facility] will see patients 53 years of age and older. One Wednesday Evening (Monthly: Volunteer Based).  $30 per visit, cash only  [employer]  [phone] for adults; Children under age 98, call Graduate Pediatric Dentistry at [phone]. Children aged 35-14, please call [phone] to request a pediatric application.  Dental services are provided in all areas of dental care including fillings, crowns and bridges, complete and partial dentures, implants, gum treatment, root canals, and extractions. Preventive care is also provided. Treatment is provided to both adults and children. Patients are selected via a lottery and there is often a waiting list.   [facility] [address] [place]  [phone] [email or web address]   [facility] [address] [place], [place] [phone], Ext. [phone] Second and Fourth Thursday of each month, opens at 6:30 AM; Clinic ends at 9 AM.  Patients are seen on a first-come first-served basis, and a limited number are seen during each clinic.   [facility]  [address] [address] [place], [place] [phone]   Eligibility Requirements You must have lived in [place], [place], or [place] counties for at least the last three months.   You cannot be eligible for state or federal sponsored [place], including [employer], [place], or [employer].   You generally cannot be eligible for healthcare insurance through your employer.    How to apply: Eligibility screenings are held every Tuesday and Wednesday afternoon from 1:00 pm until 4:00 pm. You do not need an appointment for the interview!  [facility] [address] [place], [place] [phone]   [facility] Health Department  [phone]   [facility] Health Department  [phone]   [facility] Health Department  [phone]    Behavioral Health Resources in the Community: Intensive Outpatient Programs Organization         Address  Phone  Notes  [facility] [address] [place], [place] [phone]   [facility] [address], [place], [place] [phone]   [facility] [address] [place], [place]  [phone]   [facility] [address],  [place], [place] [phone] or [phone]   Substance Abuse Resources Organization         Address  Phone  Notes  [facility]  [phone]   [facility]  [phone]   [facility]  [phone]   Floydene Flock  [phone]   Residential & Outpatient Substance Abuse Program  [phone]   Psychological Services Organization         Address  Phone  Notes  [facility]   [phone]  [phone]   [facility]  [phone]   [facility] [address] [place] [phone] or [phone]    Mobile Crisis Teams Organization         Address  Phone  Notes  [facility], Mobile Crisis Care Unit  [phone]   [facility]  [address] [place], [place] [phone]   Doristine Locks [address] [place] [phone]    Self-Help/Support Groups Organization         Address  Phone             Notes  [facility] [place] - variety of support groups  336- I7437963 Call for more information  Narcotics Anonymous (NA), [facility] [address], [employer] [place]  2 meetings at this location   Statistician         Address  Phone  Notes  [facility] [address],    [place]  [phone]   [facility]  [address], [place] [address], [place], [place] [phone]   [facility] [address] [place], [place] [phone] Admissions: 8am-3pm M-F  [facility] [address]    [place], [place] [phone]   [facility] [address] [place], [place], [place] [phone]   [facility] [address],  [place], [place] [phone]   [facility] - Intensive Outpatient [address], [place], [place] [phone]   [facility] ([facility] [address]  [place], [place] [phone] or [phone]   [facility] ([facility]) [address] [place], [place] [phone] Accepts Medicaid  [facility] [address],  [place] [phone] Substance Abuse/Addiction Treatment   [facility] Organization         Address  Phone  Notes  [facility]  [phone]   Angie Fava, PhD [address] Knack [place], [place]   [phone] or  [phone]   [facility]   [address] [place], [place] [phone]   [facility] [address] [place], [place] [phone] Insurance/Medicaid/sponsorship through [facility] and Families [address]                                    [place], [place] [phone] Therapy/tele-psych/case  [facility] [address], [place] [phone]    Dr. Lolly Mustache  [phone]   [facility]  [employer] [facility] Dept. 1) [address], [place] 2) [address], [place] 3)  [address], [place] [phone]  [phone]   [facility] Child Abuse Hotline [phone] or [phone] (After Hours)

## 2015-02-03 ENCOUNTER — Emergency Department (HOSPITAL_COMMUNITY): Payer: Medicare Other

## 2015-02-03 ENCOUNTER — Emergency Department (HOSPITAL_COMMUNITY)
Admission: EM | Admit: 2015-02-03 | Discharge: 2015-02-03 | Disposition: A | Discharge status: Home / Self Care | Payer: Medicare Other | Attending: Emergency Medicine | Admitting: Emergency Medicine

## 2015-02-03 ENCOUNTER — Encounter (HOSPITAL_COMMUNITY): Payer: Self-pay | Admitting: Emergency Medicine

## 2015-02-03 DIAGNOSIS — Z79899 Other long term (current) drug therapy: Secondary | ICD-10-CM | POA: Insufficient documentation

## 2015-02-03 DIAGNOSIS — S43402A Unspecified sprain of left shoulder joint, initial encounter: Secondary | ICD-10-CM | POA: Diagnosis not present

## 2015-02-03 DIAGNOSIS — M1990 Unspecified osteoarthritis, unspecified site: Secondary | ICD-10-CM | POA: Insufficient documentation

## 2015-02-03 DIAGNOSIS — F329 Major depressive disorder, single episode, unspecified: Secondary | ICD-10-CM | POA: Diagnosis not present

## 2015-02-03 DIAGNOSIS — F419 Anxiety disorder, unspecified: Secondary | ICD-10-CM | POA: Insufficient documentation

## 2015-02-03 DIAGNOSIS — Z8701 Personal history of pneumonia (recurrent): Secondary | ICD-10-CM | POA: Diagnosis not present

## 2015-02-03 DIAGNOSIS — I4891 Unspecified atrial fibrillation: Secondary | ICD-10-CM | POA: Insufficient documentation

## 2015-02-03 DIAGNOSIS — F0390 Unspecified dementia without behavioral disturbance: Secondary | ICD-10-CM | POA: Diagnosis not present

## 2015-02-03 DIAGNOSIS — S63502A Unspecified sprain of left wrist, initial encounter: Secondary | ICD-10-CM | POA: Diagnosis not present

## 2015-02-03 DIAGNOSIS — S139XXA Sprain of joints and ligaments of unspecified parts of neck, initial encounter: Secondary | ICD-10-CM

## 2015-02-03 DIAGNOSIS — Z7982 Long term (current) use of aspirin: Secondary | ICD-10-CM | POA: Diagnosis not present

## 2015-02-03 DIAGNOSIS — Y9389 Activity, other specified: Secondary | ICD-10-CM | POA: Insufficient documentation

## 2015-02-03 DIAGNOSIS — Y998 Other external cause status: Secondary | ICD-10-CM | POA: Insufficient documentation

## 2015-02-03 DIAGNOSIS — I10 Essential (primary) hypertension: Secondary | ICD-10-CM | POA: Diagnosis not present

## 2015-02-03 DIAGNOSIS — Y92410 Unspecified street and highway as the place of occurrence of the external cause: Secondary | ICD-10-CM | POA: Diagnosis not present

## 2015-02-03 DIAGNOSIS — S134XXA Sprain of ligaments of cervical spine, initial encounter: Secondary | ICD-10-CM | POA: Insufficient documentation

## 2015-02-03 DIAGNOSIS — Z87891 Personal history of nicotine dependence: Secondary | ICD-10-CM | POA: Diagnosis not present

## 2015-02-03 DIAGNOSIS — Z87820 Personal history of traumatic brain injury: Secondary | ICD-10-CM | POA: Diagnosis not present

## 2015-02-03 DIAGNOSIS — S199XXA Unspecified injury of neck, initial encounter: Secondary | ICD-10-CM | POA: Diagnosis present

## 2015-02-03 MED ORDER — TRAMADOL HCL 50 MG PO TABS: 50.0000 mg | ORAL_TABLET | Freq: Four times a day (QID) | ORAL | Status: DC | PRN
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
## 2015-05-02 ENCOUNTER — Encounter: Payer: Self-pay | Admitting: Cardiovascular Disease

## 2015-05-02 ENCOUNTER — Ambulatory Visit (INDEPENDENT_AMBULATORY_CARE_PROVIDER_SITE_OTHER): Payer: Medicare Other | Admitting: Cardiovascular Disease

## 2015-05-02 ENCOUNTER — Telehealth: Payer: Self-pay | Admitting: Family Medicine

## 2015-05-02 VITALS — BP 112/64 | HR 94 | Ht 67.0 in | Wt 146.0 lb

## 2015-05-02 DIAGNOSIS — F32A Depression, unspecified: Secondary | ICD-10-CM

## 2015-05-02 DIAGNOSIS — I10 Essential (primary) hypertension: Secondary | ICD-10-CM

## 2015-05-02 DIAGNOSIS — E785 Hyperlipidemia, unspecified: Secondary | ICD-10-CM

## 2015-05-02 DIAGNOSIS — F329 Major depressive disorder, single episode, unspecified: Secondary | ICD-10-CM | POA: Diagnosis not present

## 2015-05-02 DIAGNOSIS — I2510 Atherosclerotic heart disease of native coronary artery without angina pectoris: Secondary | ICD-10-CM | POA: Diagnosis not present

## 2015-05-02 DIAGNOSIS — Z125 Encounter for screening for malignant neoplasm of prostate: Secondary | ICD-10-CM

## 2015-05-02 DIAGNOSIS — Z1159 Encounter for screening for other viral diseases: Secondary | ICD-10-CM

## 2015-05-02 MED ORDER — MIRTAZAPINE 30 MG PO TABS: 30.0000 mg | ORAL_TABLET | Freq: Every day | ORAL | Status: AC

## 2015-05-02 MED ORDER — LISINOPRIL 20 MG PO TABS: 20.0000 mg | ORAL_TABLET | Freq: Every day | ORAL | Status: AC

## 2015-05-02 NOTE — Addendum Note (Signed)
Addended by: Abelino Derrick R on: 05/02/2015 11:23 AM   Modules accepted: Orders

## 2015-05-02 NOTE — Telephone Encounter (Addendum)
I called pt directly based on info in his cardiology note sent to me States he is "not interested" in coming in but will have labs. Pls order fasting lipid, cmp, PSA and Hep C and mail them to his home Stated he did get the flu vaaccine at the pharmacy, I entered historically

## 2015-05-02 NOTE — Telephone Encounter (Signed)
Labs ordered and mailed to patient.  
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
## 2015-05-02 NOTE — Addendum Note (Signed)
Addended by: Kandis Fantasia B on: 05/02/2015 01:25 PM   Modules accepted: Orders

## 2015-05-02 NOTE — Patient Instructions (Signed)

## 2015-05-31 ENCOUNTER — Ambulatory Visit (INDEPENDENT_AMBULATORY_CARE_PROVIDER_SITE_OTHER): Payer: Medicare Other | Admitting: Orthopaedic Surgery

## 2015-05-31 ENCOUNTER — Encounter: Payer: Self-pay | Admitting: Orthopaedic Surgery

## 2015-05-31 VITALS — BP 155/95 | HR 84 | Temp 98.1°F | Resp 16 | Ht 67.0 in | Wt 155.0 lb

## 2015-05-31 DIAGNOSIS — M79604 Pain in right leg: Secondary | ICD-10-CM | POA: Diagnosis not present

## 2015-05-31 MED ORDER — HYDROCODONE-ACETAMINOPHEN 7.5-325 MG PO TABS
1.0000 | ORAL_TABLET | ORAL | Status: DC | PRN
Start: 2015-05-31 — End: 2015-06-27

## 2015-05-31 NOTE — Patient Instructions (Signed)
GET SUPPORT HOSE
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
## 2015-06-26 ENCOUNTER — Telehealth: Payer: Self-pay | Admitting: Orthopaedic Surgery

## 2015-06-26 NOTE — Telephone Encounter (Signed)
Patient requesting refill of Hydrocodone-Actaminophen  7.5/325mg   Qty 100 Tablets

## 2015-06-27 MED ORDER — HYDROCODONE-ACETAMINOPHEN 7.5-325 MG PO TABS: 1.0000 | ORAL_TABLET | ORAL | Status: DC | PRN

## 2015-06-27 NOTE — Telephone Encounter (Signed)
Rx done. 

## 2015-07-26 ENCOUNTER — Telehealth: Payer: Self-pay | Admitting: Orthopaedic Surgery

## 2015-07-26 MED ORDER — HYDROCODONE-ACETAMINOPHEN 5-325 MG PO TABS: 1.0000 | ORAL_TABLET | ORAL | Status: DC | PRN

## 2015-07-26 NOTE — Telephone Encounter (Signed)
Rx Done . 

## 2015-07-26 NOTE — Telephone Encounter (Signed)
Hydrocodone-Acetaminophen  7.5/325mg  Qty 100 Tablets °

## 2015-08-23 ENCOUNTER — Emergency Department (HOSPITAL_COMMUNITY): Payer: Medicare Other

## 2015-08-23 ENCOUNTER — Encounter (HOSPITAL_COMMUNITY): Payer: Self-pay | Admitting: *Deleted

## 2015-08-23 ENCOUNTER — Inpatient Hospital Stay (HOSPITAL_COMMUNITY)
Admission: EM | Admit: 2015-08-23 | Discharge: 2015-08-25 | DRG: 192 | Disposition: A | Discharge status: Home / Self Care | Payer: Medicare Other | Attending: Internal Medicine | Admitting: Internal Medicine

## 2015-08-23 DIAGNOSIS — I4891 Unspecified atrial fibrillation: Secondary | ICD-10-CM | POA: Diagnosis present

## 2015-08-23 DIAGNOSIS — J441 Chronic obstructive pulmonary disease with (acute) exacerbation: Secondary | ICD-10-CM | POA: Diagnosis not present

## 2015-08-23 DIAGNOSIS — Z87442 Personal history of urinary calculi: Secondary | ICD-10-CM | POA: Diagnosis not present

## 2015-08-23 DIAGNOSIS — Z8249 Family history of ischemic heart disease and other diseases of the circulatory system: Secondary | ICD-10-CM | POA: Diagnosis not present

## 2015-08-23 DIAGNOSIS — Z841 Family history of disorders of kidney and ureter: Secondary | ICD-10-CM | POA: Diagnosis not present

## 2015-08-23 DIAGNOSIS — I10 Essential (primary) hypertension: Secondary | ICD-10-CM | POA: Diagnosis not present

## 2015-08-23 DIAGNOSIS — E785 Hyperlipidemia, unspecified: Secondary | ICD-10-CM | POA: Diagnosis present

## 2015-08-23 DIAGNOSIS — F0390 Unspecified dementia without behavioral disturbance: Secondary | ICD-10-CM | POA: Diagnosis present

## 2015-08-23 DIAGNOSIS — I2510 Atherosclerotic heart disease of native coronary artery without angina pectoris: Secondary | ICD-10-CM | POA: Diagnosis present

## 2015-08-23 DIAGNOSIS — Z87891 Personal history of nicotine dependence: Secondary | ICD-10-CM | POA: Diagnosis not present

## 2015-08-23 DIAGNOSIS — J438 Other emphysema: Secondary | ICD-10-CM

## 2015-08-23 DIAGNOSIS — J449 Chronic obstructive pulmonary disease, unspecified: Secondary | ICD-10-CM | POA: Diagnosis present

## 2015-08-23 DIAGNOSIS — R0602 Shortness of breath: Secondary | ICD-10-CM | POA: Diagnosis not present

## 2015-08-23 DIAGNOSIS — E876 Hypokalemia: Secondary | ICD-10-CM | POA: Diagnosis present

## 2015-08-23 DIAGNOSIS — Z833 Family history of diabetes mellitus: Secondary | ICD-10-CM

## 2015-08-23 DIAGNOSIS — J45909 Unspecified asthma, uncomplicated: Secondary | ICD-10-CM | POA: Diagnosis present

## 2015-08-23 LAB — CREATININE, SERUM: CREATININE: 0.69 mg/dL (ref 0.61–1.24)

## 2015-08-23 LAB — CBC WITH DIFFERENTIAL/PLATELET
Basophils Absolute: 0 10*3/uL (ref 0.0–0.1)
Basophils Relative: 1 %
EOS PCT: 2 %
Eosinophils Absolute: 0.1 10*3/uL (ref 0.0–0.7)
HCT: 44.4 % (ref 39.0–52.0)
Hemoglobin: 15.4 g/dL (ref 13.0–17.0)
LYMPHS ABS: 1.5 10*3/uL (ref 0.7–4.0)
LYMPHS PCT: 24 %
MCH: 34.5 pg — AB (ref 26.0–34.0)
MCHC: 34.7 g/dL (ref 30.0–36.0)
MCV: 99.3 fL (ref 78.0–100.0)
MONO ABS: 0.7 10*3/uL (ref 0.1–1.0)
Monocytes Relative: 12 %
Neutro Abs: 3.8 10*3/uL (ref 1.7–7.7)
Neutrophils Relative %: 61 %
PLATELETS: 211 10*3/uL (ref 150–400)
RBC: 4.47 MIL/uL (ref 4.22–5.81)
RDW: 13 % (ref 11.5–15.5)
WBC: 6.2 10*3/uL (ref 4.0–10.5)

## 2015-08-23 LAB — COMPREHENSIVE METABOLIC PANEL
ALT: 55 U/L (ref 17–63)
AST: 77 U/L — ABNORMAL HIGH (ref 15–41)
Albumin: 3.9 g/dL (ref 3.5–5.0)
Alkaline Phosphatase: 71 U/L (ref 38–126)
Anion gap: 11 (ref 5–15)
BILIRUBIN TOTAL: 0.7 mg/dL (ref 0.3–1.2)
BUN: 12 mg/dL (ref 6–20)
CALCIUM: 8.7 mg/dL — AB (ref 8.9–10.3)
CO2: 26 mmol/L (ref 22–32)
Chloride: 100 mmol/L — ABNORMAL LOW (ref 101–111)
Creatinine, Ser: 0.5 mg/dL — ABNORMAL LOW (ref 0.61–1.24)
GLUCOSE: 108 mg/dL — AB (ref 65–99)
Potassium: 3.2 mmol/L — ABNORMAL LOW (ref 3.5–5.1)
Sodium: 137 mmol/L (ref 135–145)
TOTAL PROTEIN: 7.1 g/dL (ref 6.5–8.1)

## 2015-08-23 LAB — TROPONIN I
Troponin I: 0.04 ng/mL — ABNORMAL HIGH (ref <0.031)
Troponin I: <0.03 ng/mL (ref <0.031)

## 2015-08-23 LAB — BRAIN NATRIURETIC PEPTIDE: B Natriuretic Peptide: 35 pg/mL (ref 0.0–100.0)

## 2015-08-23 LAB — MAGNESIUM: Magnesium: 1.6 mg/dL — ABNORMAL LOW (ref 1.7–2.4)

## 2015-08-23 LAB — D-DIMER, QUANTITATIVE: D-Dimer, Quant: 0.54 ug/mL-FEU — ABNORMAL HIGH (ref 0.00–0.50)

## 2015-08-23 MED ORDER — BACITRACIN-NEOMYCIN-POLYMYXIN 400-5-5000 EX OINT
TOPICAL_OINTMENT | Freq: Two times a day (BID) | CUTANEOUS | Status: DC
  Administered 2015-08-23 – 2015-08-25 (×4): 1 via TOPICAL
  Filled 2015-08-23 (×4): qty 1

## 2015-08-23 MED ORDER — IPRATROPIUM-ALBUTEROL 0.5-2.5 (3) MG/3ML IN SOLN
3.0000 mL | Freq: Four times a day (QID) | RESPIRATORY_TRACT | Status: DC
  Administered 2015-08-23 (×2): 3 mL via RESPIRATORY_TRACT
  Filled 2015-08-23 (×2): qty 3

## 2015-08-23 MED ORDER — PREDNISONE 50 MG PO TABS
60.0000 mg | ORAL_TABLET | Freq: Once | ORAL | Status: AC
  Administered 2015-08-23: 60 mg via ORAL
  Filled 2015-08-23: qty 1

## 2015-08-23 MED ORDER — ASPIRIN 81 MG PO CHEW
324.0000 mg | CHEWABLE_TABLET | Freq: Once | ORAL | Status: AC
  Administered 2015-08-23: 324 mg via ORAL
  Filled 2015-08-23: qty 4

## 2015-08-23 MED ORDER — MAGNESIUM SULFATE 2 GM/50ML IV SOLN
2.0000 g | Freq: Once | INTRAVENOUS | Status: AC
  Administered 2015-08-23: 2 g via INTRAVENOUS
  Filled 2015-08-23: qty 50

## 2015-08-23 MED ORDER — ASPIRIN 325 MG PO TABS
325.0000 mg | ORAL_TABLET | Freq: Once | ORAL | Status: AC
  Administered 2015-08-23: 325 mg via ORAL
  Filled 2015-08-23: qty 1

## 2015-08-23 MED ORDER — POTASSIUM CHLORIDE CRYS ER 20 MEQ PO TBCR
40.0000 meq | EXTENDED_RELEASE_TABLET | Freq: Once | ORAL | Status: AC
  Administered 2015-08-23: 40 meq via ORAL
  Filled 2015-08-23: qty 2

## 2015-08-23 MED ORDER — LISINOPRIL 10 MG PO TABS: 20.0000 mg | ORAL_TABLET | Freq: Every day | ORAL | Status: DC

## 2015-08-23 MED ORDER — METHYLPREDNISOLONE SODIUM SUCC 125 MG IJ SOLR
60.0000 mg | Freq: Two times a day (BID) | INTRAMUSCULAR | Status: DC
  Administered 2015-08-23 – 2015-08-24 (×3): 60 mg via INTRAVENOUS
  Filled 2015-08-23 (×3): qty 2

## 2015-08-23 MED ORDER — IOPAMIDOL (ISOVUE-370) INJECTION 76%
100.0000 mL | Freq: Once | INTRAVENOUS | Status: AC | PRN
  Administered 2015-08-23: 100 mL via INTRAVENOUS

## 2015-08-23 MED ORDER — HYDROCODONE-ACETAMINOPHEN 5-325 MG PO TABS
1.0000 | ORAL_TABLET | Freq: Once | ORAL | Status: AC
  Administered 2015-08-23: 1 via ORAL
  Filled 2015-08-23: qty 1

## 2015-08-23 MED ORDER — ACETAMINOPHEN 650 MG RE SUPP
650.0000 mg | Freq: Four times a day (QID) | RECTAL | Status: DC | PRN
Start: 2015-08-23 — End: 2015-08-25

## 2015-08-23 MED ORDER — LISINOPRIL 10 MG PO TABS
20.0000 mg | ORAL_TABLET | Freq: Once | ORAL | Status: AC
  Administered 2015-08-23: 20 mg via ORAL
  Filled 2015-08-23: qty 2

## 2015-08-23 MED ORDER — BACITRACIN-NEOMYCIN-POLYMYXIN 400-5-5000 EX OINT: TOPICAL_OINTMENT | Freq: Two times a day (BID) | CUTANEOUS | Status: DC

## 2015-08-23 MED ORDER — ACETAMINOPHEN 325 MG PO TABS: 650.0000 mg | ORAL_TABLET | Freq: Four times a day (QID) | ORAL | Status: DC | PRN

## 2015-08-23 MED ORDER — ALBUTEROL SULFATE (2.5 MG/3ML) 0.083% IN NEBU
5.0000 mg | INHALATION_SOLUTION | Freq: Once | RESPIRATORY_TRACT | Status: AC
  Administered 2015-08-23: 5 mg via RESPIRATORY_TRACT
  Filled 2015-08-23: qty 6

## 2015-08-23 MED ORDER — ONDANSETRON HCL 4 MG PO TABS: 4.0000 mg | ORAL_TABLET | Freq: Four times a day (QID) | ORAL | Status: DC | PRN

## 2015-08-23 MED ORDER — ONDANSETRON HCL 4 MG/2ML IJ SOLN: 4.0000 mg | Freq: Four times a day (QID) | INTRAMUSCULAR | Status: DC | PRN

## 2015-08-23 MED ORDER — IPRATROPIUM-ALBUTEROL 0.5-2.5 (3) MG/3ML IN SOLN: 3.0000 mL | RESPIRATORY_TRACT | Status: DC | PRN

## 2015-08-23 MED ORDER — IPRATROPIUM-ALBUTEROL 0.5-2.5 (3) MG/3ML IN SOLN
3.0000 mL | Freq: Three times a day (TID) | RESPIRATORY_TRACT | Status: DC
  Administered 2015-08-24 – 2015-08-25 (×5): 3 mL via RESPIRATORY_TRACT
  Filled 2015-08-23 (×5): qty 3

## 2015-08-23 MED ORDER — IPRATROPIUM-ALBUTEROL 0.5-2.5 (3) MG/3ML IN SOLN: 3.0000 mL | RESPIRATORY_TRACT | Status: DC

## 2015-08-23 MED ORDER — ALBUTEROL (5 MG/ML) CONTINUOUS INHALATION SOLN
10.0000 mg/h | INHALATION_SOLUTION | Freq: Once | RESPIRATORY_TRACT | Status: AC
  Administered 2015-08-23: 10 mg/h via RESPIRATORY_TRACT
  Filled 2015-08-23: qty 20

## 2015-08-23 MED ORDER — IPRATROPIUM-ALBUTEROL 0.5-2.5 (3) MG/3ML IN SOLN
3.0000 mL | Freq: Once | RESPIRATORY_TRACT | Status: AC
  Administered 2015-08-23: 3 mL via RESPIRATORY_TRACT

## 2015-08-23 MED ORDER — ASPIRIN EC 81 MG PO TBEC
81.0000 mg | DELAYED_RELEASE_TABLET | Freq: Every day | ORAL | Status: DC
  Administered 2015-08-24 – 2015-08-25 (×2): 81 mg via ORAL
  Filled 2015-08-23 (×2): qty 1

## 2015-08-23 MED ORDER — PREDNISONE 20 MG PO TABS: 40.0000 mg | ORAL_TABLET | Freq: Once | ORAL | Status: DC

## 2015-08-23 MED ORDER — ASPIRIN EC 81 MG PO TBEC: 81.0000 mg | DELAYED_RELEASE_TABLET | Freq: Every day | ORAL | Status: DC

## 2015-08-23 MED ORDER — HEPARIN SODIUM (PORCINE) 5000 UNIT/ML IJ SOLN
5000.0000 [IU] | Freq: Three times a day (TID) | INTRAMUSCULAR | Status: DC
  Administered 2015-08-23 – 2015-08-25 (×6): 5000 [IU] via SUBCUTANEOUS
  Filled 2015-08-23 (×7): qty 1

## 2015-08-23 MED ORDER — IPRATROPIUM-ALBUTEROL 0.5-2.5 (3) MG/3ML IN SOLN
RESPIRATORY_TRACT | Status: AC
  Filled 2015-08-23: qty 3

## 2015-08-23 MED ORDER — LISINOPRIL 10 MG PO TABS
20.0000 mg | ORAL_TABLET | Freq: Every day | ORAL | Status: DC
  Administered 2015-08-24 – 2015-08-25 (×2): 20 mg via ORAL
  Filled 2015-08-23 (×2): qty 2

## 2015-08-23 MED ORDER — SODIUM CHLORIDE 0.9% FLUSH
3.0000 mL | Freq: Two times a day (BID) | INTRAVENOUS | Status: DC
  Administered 2015-08-23 – 2015-08-25 (×4): 3 mL via INTRAVENOUS

## 2015-08-23 MED ORDER — MIRTAZAPINE 30 MG PO TABS
30.0000 mg | ORAL_TABLET | Freq: Every day | ORAL | Status: DC
  Administered 2015-08-23 – 2015-08-24 (×2): 30 mg via ORAL
  Filled 2015-08-23 (×2): qty 1

## 2015-08-23 MED ORDER — HYDROCODONE-ACETAMINOPHEN 5-325 MG PO TABS
1.0000 | ORAL_TABLET | ORAL | Status: DC | PRN
  Administered 2015-08-23 – 2015-08-24 (×2): 1 via ORAL
  Filled 2015-08-23 (×3): qty 1
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
## 2015-08-23 NOTE — ED Notes (Signed)
RT at bedside.

## 2015-08-23 NOTE — ED Provider Notes (Signed)
Pt still tachypneic and symptomatic after hour long neb Will admit D/w dr Rhona Leavenschiu for admission  CRITICAL CARE Performed by: Joya GaskinsWICKLINE,Anice Wilshire W Total critical care time: 33 minutes Critical care time was exclusive of separately billable procedures and treating other patients. Critical care was necessary to treat or prevent imminent or life-threatening deterioration. Critical care was time spent personally by me on the following activities: development of treatment plan with patient and/or surrogate as well as nursing, discussions with consultants, evaluation of patient's response to treatment, examination of patient, obtaining history from patient or surrogate, ordering and performing treatments and interventions, ordering and review of laboratory studies, ordering and review of radiographic studies, pulse oximetry and re-evaluation of patient's condition.  Medications  ipratropium-albuterol (DUONEB) 0.5-2.5 (3) MG/3ML nebulizer solution (  Not Given 08/23/15 0652)  aspirin tablet 325 mg (325 mg Oral Given 08/23/15 0635)  lisinopril (PRINIVIL,ZESTRIL) tablet 20 mg (20 mg Oral Given 08/23/15 0635)  ipratropium-albuterol (DUONEB) 0.5-2.5 (3) MG/3ML nebulizer solution 3 mL (3 mLs Nebulization Given 08/23/15 0647)  predniSONE (DELTASONE) tablet 60 mg (60 mg Oral Given 08/23/15 0749)  HYDROcodone-acetaminophen (NORCO/VICODIN) 5-325 MG per tablet 1 tablet (1 tablet Oral Given 08/23/15 0812)  albuterol (PROVENTIL) (2.5 MG/3ML) 0.083% nebulizer solution 5 mg (5 mg Nebulization Given 08/23/15 0848)  iopamidol (ISOVUE-370) 76 % injection 100 mL (100 mLs Intravenous Contrast Given 08/23/15 0928)  albuterol (PROVENTIL,VENTOLIN) solution continuous neb (10 mg/hr Nebulization Given 08/23/15 1008)  aspirin chewable tablet 324 mg (324 mg Oral Given 08/23/15 0958)     Zadie Rhineonald Dusty Wagoner, MD 08/23/15 1151

## 2015-08-23 NOTE — ED Provider Notes (Signed)
Pt still having SOB He reports increased dyspnea on exertion He is wheezing Due to elevated d-dimer will get CT chest and give another round of nebs   Kent Rhineonald Sandrea Boer, MD 08/23/15 [phone]

## 2015-08-23 NOTE — ED Provider Notes (Signed)
At signout plan to f/u on labs/imaging and reassess patient   Kent Morrow Kathlene Yano, MD 08/23/15 [phone]

## 2015-08-23 NOTE — ED Provider Notes (Signed)
EKG Interpretation  Date/Time:  Wednesday Aug 23 2015 09:56:15 EDT Ventricular Rate:  102 PR Interval:  160 QRS Duration: 97 QT Interval:  377 QTC Calculation: 491 R Axis:   63 Text Interpretation:  Sinus tachycardia Borderline prolonged QT interval Confirmed by Bebe ShaggyWICKLINE  MD, Dorinda HillNALD ([id number]) on 08/23/2015 10:04:30 AM      Ct chest neg for PE Will give another round of nebs and repeat troponin   Kent Rhineonald Tyreik Delahoussaye, MD 08/23/15 1004
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
## 2015-08-23 NOTE — ED Notes (Signed)
Pt ambulated with one person assist. Pt oxygen stayed between 95% and 100% on RA. Pt was short of breath half way through walking despite oxygen saturation.

## 2015-08-23 NOTE — ED Notes (Signed)
Patient ambulated in hallway.  Patient's o2 sat on room air maintained at 94-98%. Patient stated "I feel weak but I think this is from not eating".

## 2015-08-23 NOTE — ED Notes (Addendum)
Pt completed last albuterol tx and continues to have shortness of breath. He is more comfortable sitting in tri-pod position at the bedside. Pt oxygen saturation is 96% and above. MD made aware.   Pt was not ambulated since he was already having shortness of breath. MD made aware.

## 2015-08-23 NOTE — ED Notes (Signed)
Pt c/o sob that started this am when he woke up; pt has bilateral lower leg edema

## 2015-08-24 LAB — CBC
HCT: 44.8 % (ref 39.0–52.0)
Hemoglobin: 15.1 g/dL (ref 13.0–17.0)
MCH: 33.9 pg (ref 26.0–34.0)
MCHC: 33.7 g/dL (ref 30.0–36.0)
MCV: 100.4 fL — AB (ref 78.0–100.0)
PLATELETS: 231 10*3/uL (ref 150–400)
RBC: 4.46 MIL/uL (ref 4.22–5.81)
RDW: 13.3 % (ref 11.5–15.5)
WBC: 12.6 10*3/uL — ABNORMAL HIGH (ref 4.0–10.5)

## 2015-08-24 LAB — COMPREHENSIVE METABOLIC PANEL
ALBUMIN: 3.5 g/dL (ref 3.5–5.0)
ALK PHOS: 57 U/L (ref 38–126)
ALT: 49 U/L (ref 17–63)
AST: 64 U/L — AB (ref 15–41)
Anion gap: 8 (ref 5–15)
BUN: 14 mg/dL (ref 6–20)
CALCIUM: 9 mg/dL (ref 8.9–10.3)
CHLORIDE: 103 mmol/L (ref 101–111)
CO2: 26 mmol/L (ref 22–32)
CREATININE: 0.47 mg/dL — AB (ref 0.61–1.24)
GFR calc non Af Amer: >60 mL/min (ref >60)
GLUCOSE: 134 mg/dL — AB (ref 65–99)
Potassium: 4.2 mmol/L (ref 3.5–5.1)
SODIUM: 137 mmol/L (ref 135–145)
Total Bilirubin: 1 mg/dL (ref 0.3–1.2)
Total Protein: 6.6 g/dL (ref 6.5–8.1)

## 2015-08-24 MED ORDER — METHYLPREDNISOLONE SODIUM SUCC 40 MG IJ SOLR
40.0000 mg | Freq: Two times a day (BID) | INTRAMUSCULAR | Status: DC
  Administered 2015-08-25 (×2): 40 mg via INTRAVENOUS
  Filled 2015-08-24 (×2): qty 1

## 2015-08-24 NOTE — Progress Notes (Signed)
PROGRESS NOTE    Kent Morrow  ZOX:[id number] DOB: 06-Jan-1946 DOA: 08/23/2015 PCP: [name], MD    Brief Narrative:  70 y.o. male with medical history significant of CAD, COPD, prior tobacco abuse who presented to the emergency department with complaints of worsening shortness of breath and wheezing. Patient reports increased cough and sputum production. Patient denies chest pain or nausea   Assessment & Plan:   Principal Problem:   COPD exacerbation (HCC) Active Problems:   Hyperlipemia   Essential hypertension   COPD (chronic obstructive pulmonary disease) (HCC)   COPD exacerbation - Pt presented with sob and end-expiratory wheezing - Will continue scheduled duonebs q6hrs with q2hrs PRN - Patient clinically improved. Still with decreased breath sounds on exam. - We'll continue Solu-Medrol twice a day dosing, but decreased from 60 mg to 40 mg  HLD - Stable at present  HTN - BP currently stable - Cont to monitor  Hypokalemia - Suspect secondary to recent neb tx - Corrected  Hypomagnesemia - Replaced  DVT prophylaxis: Heparin subcutaneous Code Status: Full Family Communication: Patient in room, family not at bedside Disposition Plan: Possible discharge home in 24 hours   Consultants:     Procedures:     Antimicrobials:  Antibiotics Given (last 72 hours)    None          Subjective: Patient reports feeling better.  Objective: Filed Vitals:   08/23/15 2144 08/24/15 0626 08/24/15 0753 08/24/15 1332  BP: 142/74 133/74  123/76  Pulse: 83 94  81  Temp: 98.3 F (36.8 C) 98.1 F (36.7 C)  97.8 F (36.6 C)  TempSrc: Oral Oral  Oral  Resp:  18  18  Height:      Weight:      SpO2: 94% 95% 97% 92%    Intake/Output Summary (Last 24 hours) at 08/24/15 1443 Last data filed at 08/24/15 1230  Gross per 24 hour  Intake    770 ml  Output   1100 ml  Net   -330 ml   Filed Weights   08/23/15 1242  Weight: 72 kg (158 lb 11.7 oz)     Examination:  General exam: Appears calm and comfortable, sitting in bed Respiratory system: Clear to auscultation. Respiratory effort normal. Decreased breath sounds Cardiovascular system: S1 & S2 heard, RRR. No pedal edema. Gastrointestinal system: Abdomen is nondistended, soft and nontender. No organomegaly or masses felt. Normal bowel sounds heard. Central nervous system: Alert and oriented. No focal neurological deficits. Extremities: Symmetric 5 x 5 power. Skin: No rashes, lesions Psychiatry: Judgement and insight appear normal. Mood & affect appropriate.     Data Reviewed: I have personally reviewed following labs and imaging studies  CBC:  Recent Labs Lab 08/23/15 0632 08/24/15 0544  WBC 6.2 12.6*  NEUTROABS 3.8  --   HGB 15.4 15.1  HCT 44.4 44.8  MCV 99.3 100.4*  PLT 211 231   Basic Metabolic Panel:  Recent Labs Lab 08/23/15 0632 08/23/15 1329 08/24/15 0544  NA 137  --  137  K 3.2*  --  4.2  CL 100*  --  103  CO2 26  --  26  GLUCOSE 108*  --  134*  BUN 12  --  14  CREATININE 0.50* 0.69 0.47*  CALCIUM 8.7*  --  9.0  MG  --  1.6*  --    GFR: Estimated Creatinine Clearance: 79.8 mL/min (by C-G formula based on Cr of 0.47). Liver Function Tests:  Recent Labs Lab  08/23/15 0632 08/24/15 0544  AST 77* 64*  ALT 55 49  ALKPHOS 71 57  BILITOT 0.7 1.0  PROT 7.1 6.6  ALBUMIN 3.9 3.5   No results for input(s): LIPASE, AMYLASE in the last 168 hours. No results for input(s): AMMONIA in the last 168 hours. Coagulation Profile: No results for input(s): INR, PROTIME in the last 168 hours. Cardiac Enzymes:  Recent Labs Lab 08/23/15 0632 08/23/15 1007  TROPONINI 0.04* <0.03   BNP (last 3 results) No results for input(s): PROBNP in the last 8760 hours. HbA1C: No results for input(s): HGBA1C in the last 72 hours. CBG: No results for input(s): GLUCAP in the last 168 hours. Lipid Profile: No results for input(s): CHOL, HDL, LDLCALC, TRIG, CHOLHDL,  LDLDIRECT in the last 72 hours. Thyroid Function Tests: No results for input(s): TSH, T4TOTAL, FREET4, T3FREE, THYROIDAB in the last 72 hours. Anemia Panel: No results for input(s): VITAMINB12, FOLATE, FERRITIN, TIBC, IRON, RETICCTPCT in the last 72 hours. Sepsis Labs: No results for input(s): PROCALCITON, LATICACIDVEN in the last 168 hours.  No results found for this or any previous visit (from the past 240 hour(s)).       Radiology Studies: Dg Chest 2 View  08/23/2015  CLINICAL DATA:  Oxygen desaturation, atrial fibrillation, high blood pressure, onset of shortness of breath this morning upon awakening, former smoker EXAM: CHEST  2 VIEW COMPARISON:  01/09/2015 FINDINGS: Normal heart size, mediastinal contours, and pulmonary vascularity. Severe emphysematous changes compatible with COPD. No acute infiltrate, pleural effusion, or pneumothorax. Diffuse osseous demineralization with posttraumatic deformity of proximal LEFT humerus. IMPRESSION: COPD changes without acute infiltrate. Electronically Signed   By: Ulyses Southward M.D.   On: 08/23/2015 07:47   Ct Angio Chest Pe W/cm &/or Wo Cm  08/23/2015  CLINICAL DATA:  70 year old male with shortness of breath since this morning. Bilateral lower extremity edema. A fib. Former smoker. Initial encounter. EXAM: CT ANGIOGRAPHY CHEST WITH CONTRAST TECHNIQUE: Multidetector CT imaging of the chest was performed using the standard protocol during bolus administration of intravenous contrast. Multiplanar CT image reconstructions and MIPs were obtained to evaluate the vascular anatomy. CONTRAST:  100 mL Isovue 370 COMPARISON:  Chest radiographs 0716 hours today and earlier. Chest CT 10/17/2011. FINDINGS: Suboptimal contrast bolus timing in the pulmonary arterial tree. Superimposed respiratory motion artifact intermittently throughout the study. Subsequently, evaluation of acute pulmonary embolus is limited. The central pulmonary arteries appear patent. No hilar  pulmonary embolus identified. Chronic emphysema. Respiratory motion artifact. Some retained secretions in the right mainstem bronchus. Other visualized major airways remain patent. No pleural effusion. No acute pulmonary opacity. No pericardial effusion. Fairly extensive calcified atherosclerosis of the aorta and coronary arteries. No thoracic lymphadenopathy. No cardiomegaly. Negative thyroid. Hepatic steatosis is new since 2013. Negative visualized spleen, pancreas, adrenal glands, kidneys, and bowel in the upper abdomen. Osteopenia. Chronic left upper lateral rib fractures. No acute osseous abnormality identified. Review of the MIP images confirms the above findings. IMPRESSION: 1. Limited for the evaluation of pulmonary embolus due to suboptimal contrast timing and respiratory motion. No central pulmonary embolus. If clinical suspicion persists Nuclear Medicine V/Q scan probably would be most sensitive in this setting. 2. Chronic lung disease with emphysema. No acute pulmonary findings. 3. Extensive calcified aortic and coronary artery atherosclerosis. 4. Fatty the liver disease, new since 2013. Electronically Signed   By: Odessa Fleming M.D.   On: 08/23/2015 09:52        Scheduled Meds: . aspirin EC  81 mg Oral  Daily  . heparin  5,000 Units Subcutaneous Q8H  . ipratropium-albuterol  3 mL Nebulization TID  . lisinopril  20 mg Oral Daily  . [START ON 08/25/2015] methylPREDNISolone (SOLU-MEDROL) injection  40 mg Intravenous Q12H  . mirtazapine  30 mg Oral QHS  . neomycin-bacitracin-polymyxin   Topical BID  . sodium chloride flush  3 mL Intravenous Q12H   Continuous Infusions:    LOS: 1 day    Zadie Deemer, Scheryl MartenSTEPHEN K, MD [facility] Pager [phone]  If 7PM-7AM, please contact night-coverage [email or web address] Password [email or web address] 08/24/2015, 2:43 PM

## 2015-08-25 MED ORDER — PREDNISONE 5 MG PO TABS: 5.0000 mg | ORAL_TABLET | Freq: Every day | ORAL | Status: DC

## 2015-08-25 MED ORDER — BACITRACIN-NEOMYCIN-POLYMYXIN 400-5-5000 EX OINT: TOPICAL_OINTMENT | Freq: Two times a day (BID) | CUTANEOUS | Status: AC

## 2015-08-25 NOTE — Care Management Note (Signed)
Case Management Note  Patient Details  Name: Kent Morrow MRN: [id number] Date of Birth: 04/18/1945  Subjective/Objective:   Spoke with patient who is alert and oriented from home. Stated that they can afford medications and are able to make thier medical appointments without difficulty. No DME or O2 at home. No CM needs identified.                 Action/Plan:Home with self care.  Expected Discharge Date:  08/24/15               Expected Discharge Plan:  Home/Self Care  In-House Referral:     Discharge planning Services  CM Consult  Post Acute Care Choice:    Choice offered to:     DME Arranged:    DME Agency:     HH Arranged:    HH Agency:     Status of Service:  Completed, signed off  Medicare Important Message Given:  Yes Date Medicare IM Given:    Medicare IM give by:    Date Additional Medicare IM Given:    Additional Medicare Important Message give by:     If discussed at Long Length of Stay Meetings, dates discussed:    Additional Comments:  Adonis HugueninBerkhead, Ericson Nafziger L, RN 08/25/2015, 2:02 PM

## 2015-08-25 NOTE — Care Management Important Message (Signed)
Important Message  Patient Details  Name: Kent Morrow MRN: [id number] Date of Birth: 04/24/1945   Medicare Important Message Given:  Yes    Adonis HugueninBerkhead, Anapaula Severt L, RN 08/25/2015, 8:53 AM

## 2015-08-25 NOTE — Progress Notes (Signed)
Patient ambulated 26000ft without difficulty. Patient O2 sat 93-96% on RA while ambulating.

## 2015-08-25 NOTE — Discharge Summary (Signed)
Physician Discharge Summary  Kent Morrow JXB:[id number] DOB: 1945/04/16 DOA: 08/23/2015  PCP: Kent Overman, MD  Admit date: 08/23/2015 Discharge date: 08/25/2015  Time spent: 20 minutes  Recommendations for Outpatient Follow-up:  1. Follow up in 2-3 days   Discharge Diagnoses:  Principal Problem:   COPD exacerbation (HCC) Active Problems:   Hyperlipemia   Essential hypertension   COPD (chronic obstructive pulmonary disease) (HCC)   Discharge Condition: Improved  Diet recommendation: Heart healthy  Filed Weights   08/23/15 1242  Weight: 72 kg (158 lb 11.7 oz)    History of present illness:  Please review dictated H and P from 5/24 for details. Briefly, 70 y.o. male with medical history significant of CAD, COPD, prior tobacco abuse who presented to the emergency department with complaints of worsening shortness of breath and wheezing. Patient reports increased cough and sputum production. Patient denies chest pain or nausea  Hospital Course:  COPD exacerbation - Pt presented with sob and end-expiratory wheezing - Patient was continued on scheduled duonebs q6hrs with q2hrs PRN - Patient clinically improved. - Patient was continued on scheduled IV steroids. He will complete a prednisone taper on discharge. -Patient was able to ambulate in the hallway on room air retaining appropriate O2 sats.  HLD - remained stable at present  HTN - BP remained stable  Hypokalemia - Suspect secondary to recent neb tx - Corrected  Hypomagnesemia - Replaced  Discharge Exam: Filed Vitals:   08/24/15 2016 08/24/15 2113 08/25/15 0737 08/25/15 1333  BP:  120/68    Pulse:  80    Temp:  98.2 F (36.8 C)    TempSrc:  Oral    Resp:  20    Height:      Weight:      SpO2: 95% 94% 97% 97%    General: awake, in nad Cardiovascular: regular, s1, s2 Respiratory: normal resp effort, trace end-expiratory wheezing  Discharge Instructions     Medication List    TAKE these  medications        aspirin EC 81 MG tablet  Take 81 mg by mouth daily.     HYDROcodone-acetaminophen 5-325 MG tablet  Commonly known as:  NORCO/VICODIN  Take 1 tablet by mouth every 4 (four) hours as needed for moderate pain (Must last 30 days.  Do not take and drive a car or use machinery.).     lisinopril 20 MG tablet  Commonly known as:  PRINIVIL,ZESTRIL  Take 1 tablet (20 mg total) by mouth daily.     mirtazapine 30 MG tablet  Commonly known as:  REMERON  Take 1 tablet (30 mg total) by mouth at bedtime.     multivitamin Tabs tablet  Take 1 tablet by mouth daily.     neomycin-bacitracin-polymyxin ointment  Commonly known as:  NEOSPORIN  Apply topically 2 (two) times daily. Affected areas     predniSONE 5 MG tablet  Commonly known as:  DELTASONE  Take 1 tablet (5 mg total) by mouth daily with breakfast.       Allergies  Allergen Reactions  . Sulfonamide Derivatives Anaphylaxis  . Ambien [Zolpidem Tartrate]     Impaired mental status    Follow-up Information    Follow up with Kent Overman, MD. Schedule an appointment as soon as possible for a visit in 2 weeks.   Specialty:  Family Medicine   Why:  Hospital follow up   Contact information:   [address] [place] [address] [phone]  The results of significant diagnostics from this hospitalization (including imaging, microbiology, ancillary and laboratory) are listed below for reference.    Significant Diagnostic Studies: Dg Chest 2 View  08/23/2015  CLINICAL DATA:  Oxygen desaturation, atrial fibrillation, high blood pressure, onset of shortness of breath this morning upon awakening, former smoker EXAM: CHEST  2 VIEW COMPARISON:  01/09/2015 FINDINGS: Normal heart size, mediastinal contours, and pulmonary vascularity. Severe emphysematous changes compatible with COPD. No acute infiltrate, pleural effusion, or pneumothorax. Diffuse osseous demineralization with posttraumatic deformity of  proximal LEFT humerus. IMPRESSION: COPD changes without acute infiltrate. Electronically Signed   By: Ulyses Southward M.D.   On: 08/23/2015 07:47   Ct Angio Chest Pe W/cm &/or Wo Cm  08/23/2015  CLINICAL DATA:  70 year old male with shortness of breath since this morning. Bilateral lower extremity edema. A fib. Former smoker. Initial encounter. EXAM: CT ANGIOGRAPHY CHEST WITH CONTRAST TECHNIQUE: Multidetector CT imaging of the chest was performed using the standard protocol during bolus administration of intravenous contrast. Multiplanar CT image reconstructions and MIPs were obtained to evaluate the vascular anatomy. CONTRAST:  100 mL Isovue 370 COMPARISON:  Chest radiographs 0716 hours today and earlier. Chest CT 10/17/2011. FINDINGS: Suboptimal contrast bolus timing in the pulmonary arterial tree. Superimposed respiratory motion artifact intermittently throughout the study. Subsequently, evaluation of acute pulmonary embolus is limited. The central pulmonary arteries appear patent. No hilar pulmonary embolus identified. Chronic emphysema. Respiratory motion artifact. Some retained secretions in the right mainstem bronchus. Other visualized major airways remain patent. No pleural effusion. No acute pulmonary opacity. No pericardial effusion. Fairly extensive calcified atherosclerosis of the aorta and coronary arteries. No thoracic lymphadenopathy. No cardiomegaly. Negative thyroid. Hepatic steatosis is new since 2013. Negative visualized spleen, pancreas, adrenal glands, kidneys, and bowel in the upper abdomen. Osteopenia. Chronic left upper lateral rib fractures. No acute osseous abnormality identified. Review of the MIP images confirms the above findings. IMPRESSION: 1. Limited for the evaluation of pulmonary embolus due to suboptimal contrast timing and respiratory motion. No central pulmonary embolus. If clinical suspicion persists Nuclear Medicine V/Q scan probably would be most sensitive in this setting. 2.  Chronic lung disease with emphysema. No acute pulmonary findings. 3. Extensive calcified aortic and coronary artery atherosclerosis. 4. Fatty the liver disease, new since 2013. Electronically Signed   By: Odessa Fleming M.D.   On: 08/23/2015 09:52    Microbiology: No results found for this or any previous visit (from the past 240 hour(s)).   Labs: Basic Metabolic Panel:  Recent Labs Lab 08/23/15 0632 08/23/15 1329 08/24/15 0544  NA 137  --  137  K 3.2*  --  4.2  CL 100*  --  103  CO2 26  --  26  GLUCOSE 108*  --  134*  BUN 12  --  14  CREATININE 0.50* 0.69 0.47*  CALCIUM 8.7*  --  9.0  MG  --  1.6*  --    Liver Function Tests:  Recent Labs Lab 08/23/15 0632 08/24/15 0544  AST 77* 64*  ALT 55 49  ALKPHOS 71 57  BILITOT 0.7 1.0  PROT 7.1 6.6  ALBUMIN 3.9 3.5   No results for input(s): LIPASE, AMYLASE in the last 168 hours. No results for input(s): AMMONIA in the last 168 hours. CBC:  Recent Labs Lab 08/23/15 0632 08/24/15 0544  WBC 6.2 12.6*  NEUTROABS 3.8  --   HGB 15.4 15.1  HCT 44.4 44.8  MCV 99.3 100.4*  PLT 211 231  Cardiac Enzymes:  Recent Labs Lab 08/23/15 0632 08/23/15 1007  TROPONINI 0.04* <0.03   BNP: BNP (last 3 results)  Recent Labs  08/23/15 0632  BNP 35.0    ProBNP (last 3 results) No results for input(s): PROBNP in the last 8760 hours.  CBG: No results for input(s): GLUCAP in the last 168 hours.     Signed:  Athalia Setterlund, Scheryl MartenSTEPHEN K  [facility] 08/25/2015, 5:25 PM

## 2015-08-31 ENCOUNTER — Ambulatory Visit (INDEPENDENT_AMBULATORY_CARE_PROVIDER_SITE_OTHER): Payer: Medicare Other | Admitting: Orthopaedic Surgery

## 2015-08-31 ENCOUNTER — Encounter: Payer: Self-pay | Admitting: Orthopaedic Surgery

## 2015-08-31 VITALS — BP 132/82 | HR 76 | Temp 97.7°F | Ht 66.0 in | Wt 165.0 lb

## 2015-08-31 DIAGNOSIS — I2510 Atherosclerotic heart disease of native coronary artery without angina pectoris: Secondary | ICD-10-CM | POA: Diagnosis not present

## 2015-08-31 DIAGNOSIS — I10 Essential (primary) hypertension: Secondary | ICD-10-CM | POA: Diagnosis not present

## 2015-08-31 DIAGNOSIS — J438 Other emphysema: Secondary | ICD-10-CM | POA: Diagnosis not present

## 2015-08-31 DIAGNOSIS — M79604 Pain in right leg: Secondary | ICD-10-CM | POA: Diagnosis not present

## 2015-08-31 MED ORDER — HYDROCODONE-ACETAMINOPHEN 5-325 MG PO TABS: 1.0000 | ORAL_TABLET | ORAL | Status: DC | PRN
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
## 2015-09-08 ENCOUNTER — Encounter (HOSPITAL_COMMUNITY): Payer: Self-pay | Admitting: Cardiology

## 2015-09-08 ENCOUNTER — Emergency Department (HOSPITAL_COMMUNITY)
Admission: EM | Admit: 2015-09-08 | Discharge: 2015-09-08 | Disposition: A | Discharge status: Home / Self Care | Payer: Medicare Other | Attending: Emergency Medicine | Admitting: Emergency Medicine

## 2015-09-08 DIAGNOSIS — I10 Essential (primary) hypertension: Secondary | ICD-10-CM | POA: Insufficient documentation

## 2015-09-08 DIAGNOSIS — I4891 Unspecified atrial fibrillation: Secondary | ICD-10-CM | POA: Diagnosis not present

## 2015-09-08 DIAGNOSIS — E785 Hyperlipidemia, unspecified: Secondary | ICD-10-CM | POA: Diagnosis not present

## 2015-09-08 DIAGNOSIS — Z79891 Long term (current) use of opiate analgesic: Secondary | ICD-10-CM | POA: Diagnosis not present

## 2015-09-08 DIAGNOSIS — R42 Dizziness and giddiness: Secondary | ICD-10-CM | POA: Insufficient documentation

## 2015-09-08 DIAGNOSIS — Z7982 Long term (current) use of aspirin: Secondary | ICD-10-CM | POA: Insufficient documentation

## 2015-09-08 DIAGNOSIS — M1990 Unspecified osteoarthritis, unspecified site: Secondary | ICD-10-CM | POA: Insufficient documentation

## 2015-09-08 DIAGNOSIS — Z87891 Personal history of nicotine dependence: Secondary | ICD-10-CM | POA: Diagnosis not present

## 2015-09-08 DIAGNOSIS — Z79899 Other long term (current) drug therapy: Secondary | ICD-10-CM | POA: Diagnosis not present

## 2015-09-08 DIAGNOSIS — F329 Major depressive disorder, single episode, unspecified: Secondary | ICD-10-CM | POA: Insufficient documentation

## 2015-09-08 LAB — CBC WITH DIFFERENTIAL/PLATELET
Basophils Absolute: 0 10*3/uL (ref 0.0–0.1)
Basophils Relative: 0 %
Eosinophils Absolute: 0.1 10*3/uL (ref 0.0–0.7)
Eosinophils Relative: 1 %
HCT: 45.5 % (ref 39.0–52.0)
HEMOGLOBIN: 14.9 g/dL (ref 13.0–17.0)
LYMPHS ABS: 1.6 10*3/uL (ref 0.7–4.0)
LYMPHS PCT: 15 %
MCH: 33.2 pg (ref 26.0–34.0)
MCHC: 32.7 g/dL (ref 30.0–36.0)
MCV: 101.3 fL — AB (ref 78.0–100.0)
MONOS PCT: 11 %
Monocytes Absolute: 1.1 10*3/uL — ABNORMAL HIGH (ref 0.1–1.0)
NEUTROS PCT: 73 %
Neutro Abs: 7.7 10*3/uL (ref 1.7–7.7)
Platelets: 258 10*3/uL (ref 150–400)
RBC: 4.49 MIL/uL (ref 4.22–5.81)
RDW: 13.3 % (ref 11.5–15.5)
WBC: 10.5 10*3/uL (ref 4.0–10.5)

## 2015-09-08 LAB — BASIC METABOLIC PANEL
Anion gap: 7 (ref 5–15)
BUN: 19 mg/dL (ref 6–20)
CHLORIDE: 105 mmol/L (ref 101–111)
CO2: 24 mmol/L (ref 22–32)
Calcium: 8.9 mg/dL (ref 8.9–10.3)
Creatinine, Ser: 0.58 mg/dL — ABNORMAL LOW (ref 0.61–1.24)
GFR calc Af Amer: >60 mL/min (ref >60)
GFR calc non Af Amer: >60 mL/min (ref >60)
GLUCOSE: 93 mg/dL (ref 65–99)
POTASSIUM: 4.5 mmol/L (ref 3.5–5.1)
Sodium: 136 mmol/L (ref 135–145)

## 2015-09-08 NOTE — Discharge Instructions (Signed)
Tests were good. Avoid sudden movements or shifting of your head. Follow-up your primary care doctor.

## 2015-09-08 NOTE — ED Notes (Addendum)
Sudden onset of dizziness at 1330 today.  Denies any dizziness right now.  Lower extremity edema that is worse since yesterday.  Admitted last week with COPD and states his breathing is baseline.
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
## 2015-09-08 NOTE — ED Notes (Signed)
Phlebotomy at bedside.

## 2015-09-08 NOTE — ED Notes (Signed)
EDP at bedside  

## 2015-09-27 ENCOUNTER — Telehealth: Payer: Self-pay | Admitting: Orthopaedic Surgery

## 2015-09-27 MED ORDER — HYDROCODONE-ACETAMINOPHEN 5-325 MG PO TABS: 1.0000 | ORAL_TABLET | ORAL | Status: DC | PRN

## 2015-09-27 NOTE — Telephone Encounter (Signed)
Rx Done . 

## 2015-09-27 NOTE — Telephone Encounter (Signed)
Hydrocodone-Acetaminophen  5/325mg  Qty 120 Tablets °

## 2015-10-26 ENCOUNTER — Telehealth: Payer: Self-pay | Admitting: Orthopaedic Surgery

## 2015-10-26 ENCOUNTER — Telehealth: Payer: Self-pay | Admitting: Cardiovascular Disease

## 2015-10-26 MED ORDER — HYDROCODONE-ACETAMINOPHEN 5-325 MG PO TABS: 1.0000 | ORAL_TABLET | ORAL | 0 refills | Status: DC | PRN

## 2015-10-26 NOTE — Telephone Encounter (Addendum)
Called [facility] to verify that pt has refills. Called pt unable to reach pt. No answer no voicemail.

## 2015-10-26 NOTE — Telephone Encounter (Signed)
Hydrocodone-Acetaminophen  5/325mg  Qty 120 Tablets °

## 2015-11-02 ENCOUNTER — Encounter (HOSPITAL_COMMUNITY): Payer: Self-pay | Admitting: Emergency Medicine

## 2015-11-02 ENCOUNTER — Emergency Department (HOSPITAL_COMMUNITY)
Admission: EM | Admit: 2015-11-02 | Discharge: 2015-11-02 | Disposition: A | Discharge status: Home / Self Care | Payer: Medicare Other | Attending: Emergency Medicine | Admitting: Emergency Medicine

## 2015-11-02 ENCOUNTER — Emergency Department (HOSPITAL_COMMUNITY): Payer: Medicare Other

## 2015-11-02 DIAGNOSIS — J441 Chronic obstructive pulmonary disease with (acute) exacerbation: Secondary | ICD-10-CM | POA: Diagnosis not present

## 2015-11-02 DIAGNOSIS — J449 Chronic obstructive pulmonary disease, unspecified: Secondary | ICD-10-CM | POA: Diagnosis not present

## 2015-11-02 DIAGNOSIS — Z87891 Personal history of nicotine dependence: Secondary | ICD-10-CM | POA: Diagnosis not present

## 2015-11-02 DIAGNOSIS — Z7982 Long term (current) use of aspirin: Secondary | ICD-10-CM | POA: Insufficient documentation

## 2015-11-02 DIAGNOSIS — M25471 Effusion, right ankle: Secondary | ICD-10-CM | POA: Diagnosis not present

## 2015-11-02 DIAGNOSIS — Z79899 Other long term (current) drug therapy: Secondary | ICD-10-CM | POA: Diagnosis not present

## 2015-11-02 DIAGNOSIS — M25472 Effusion, left ankle: Secondary | ICD-10-CM | POA: Diagnosis not present

## 2015-11-02 DIAGNOSIS — I2510 Atherosclerotic heart disease of native coronary artery without angina pectoris: Secondary | ICD-10-CM | POA: Diagnosis not present

## 2015-11-02 DIAGNOSIS — I10 Essential (primary) hypertension: Secondary | ICD-10-CM | POA: Diagnosis not present

## 2015-11-02 DIAGNOSIS — R0602 Shortness of breath: Secondary | ICD-10-CM | POA: Diagnosis present

## 2015-11-02 HISTORY — DX: Chronic obstructive pulmonary disease, unspecified: J44.9

## 2015-11-02 LAB — BASIC METABOLIC PANEL
ANION GAP: 9 (ref 5–15)
BUN: 12 mg/dL (ref 6–20)
CHLORIDE: 105 mmol/L (ref 101–111)
CO2: 23 mmol/L (ref 22–32)
Calcium: 9 mg/dL (ref 8.9–10.3)
Creatinine, Ser: 0.67 mg/dL (ref 0.61–1.24)
GFR calc non Af Amer: >60 mL/min (ref >60)
Glucose, Bld: 113 mg/dL — ABNORMAL HIGH (ref 65–99)
POTASSIUM: 3.9 mmol/L (ref 3.5–5.1)
SODIUM: 137 mmol/L (ref 135–145)

## 2015-11-02 LAB — CBC WITH DIFFERENTIAL/PLATELET
BASOS ABS: 0 10*3/uL (ref 0.0–0.1)
BASOS PCT: 0 %
EOS PCT: 0 %
Eosinophils Absolute: 0 10*3/uL (ref 0.0–0.7)
HCT: 47.9 % (ref 39.0–52.0)
Hemoglobin: 16.3 g/dL (ref 13.0–17.0)
LYMPHS PCT: 12 %
Lymphs Abs: 1.5 10*3/uL (ref 0.7–4.0)
MCH: 33.9 pg (ref 26.0–34.0)
MCHC: 34 g/dL (ref 30.0–36.0)
MCV: 99.6 fL (ref 78.0–100.0)
Monocytes Absolute: 1.2 10*3/uL — ABNORMAL HIGH (ref 0.1–1.0)
Monocytes Relative: 10 %
NEUTROS ABS: 9.9 10*3/uL — AB (ref 1.7–7.7)
NEUTROS PCT: 78 %
PLATELETS: 274 10*3/uL (ref 150–400)
RBC: 4.81 MIL/uL (ref 4.22–5.81)
RDW: 13.3 % (ref 11.5–15.5)
WBC: 12.7 10*3/uL — AB (ref 4.0–10.5)

## 2015-11-02 LAB — TROPONIN I

## 2015-11-02 LAB — BRAIN NATRIURETIC PEPTIDE: B Natriuretic Peptide: 23 pg/mL (ref 0.0–100.0)

## 2015-11-02 MED ORDER — DOXYCYCLINE HYCLATE 100 MG PO TABS
100.0000 mg | ORAL_TABLET | Freq: Once | ORAL | Status: AC
  Administered 2015-11-02: 100 mg via ORAL
  Filled 2015-11-02: qty 1

## 2015-11-02 MED ORDER — DOXYCYCLINE HYCLATE 100 MG PO CAPS: 100.0000 mg | ORAL_CAPSULE | Freq: Two times a day (BID) | ORAL | 0 refills | Status: AC

## 2015-11-02 MED ORDER — ALBUTEROL SULFATE (2.5 MG/3ML) 0.083% IN NEBU: 5.0000 mg | INHALATION_SOLUTION | Freq: Once | RESPIRATORY_TRACT | Status: DC

## 2015-11-02 MED ORDER — ALBUTEROL SULFATE HFA 108 (90 BASE) MCG/ACT IN AERS
2.0000 | INHALATION_SPRAY | RESPIRATORY_TRACT | Status: DC | PRN
  Administered 2015-11-02: 2 via RESPIRATORY_TRACT
  Filled 2015-11-02: qty 6.7

## 2015-11-02 MED ORDER — PREDNISONE 20 MG PO TABS: ORAL_TABLET | ORAL | 0 refills | Status: AC

## 2015-11-02 MED ORDER — IPRATROPIUM-ALBUTEROL 0.5-2.5 (3) MG/3ML IN SOLN
3.0000 mL | Freq: Once | RESPIRATORY_TRACT | Status: AC
  Administered 2015-11-02: 3 mL via RESPIRATORY_TRACT
  Filled 2015-11-02: qty 3

## 2015-11-02 MED ORDER — METHYLPREDNISOLONE SODIUM SUCC 125 MG IJ SOLR
125.0000 mg | Freq: Once | INTRAMUSCULAR | Status: AC
  Administered 2015-11-02: 125 mg via INTRAVENOUS
  Filled 2015-11-02: qty 2

## 2015-11-02 MED ORDER — HYDROCODONE-ACETAMINOPHEN 5-325 MG PO TABS
1.0000 | ORAL_TABLET | Freq: Once | ORAL | Status: AC
  Administered 2015-11-02: 1 via ORAL
  Filled 2015-11-02: qty 1

## 2015-11-02 MED ORDER — ALBUTEROL SULFATE (2.5 MG/3ML) 0.083% IN NEBU
2.5000 mg | INHALATION_SOLUTION | Freq: Once | RESPIRATORY_TRACT | Status: AC
  Administered 2015-11-02: 2.5 mg via RESPIRATORY_TRACT
  Filled 2015-11-02: qty 3

## 2015-11-02 NOTE — ED Notes (Signed)
Pt given peanut butter crackers and diet ginger ale 

## 2015-11-02 NOTE — Discharge Instructions (Signed)
Follow up with your md next week.  Return if  problems °

## 2015-11-02 NOTE — ED Triage Notes (Signed)
Pt c/o sob x 3-4hrs.
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
## 2015-11-28 ENCOUNTER — Telehealth: Payer: Self-pay | Admitting: Orthopaedic Surgery

## 2015-11-28 MED ORDER — HYDROCODONE-ACETAMINOPHEN 5-325 MG PO TABS: 1.0000 | ORAL_TABLET | Freq: Four times a day (QID) | ORAL | 0 refills | Status: DC | PRN

## 2015-11-28 NOTE — Telephone Encounter (Signed)
Hydrocodone-Acetaminophen  5/325mg  Qty 100 Tablets °

## 2015-12-05 ENCOUNTER — Ambulatory Visit (INDEPENDENT_AMBULATORY_CARE_PROVIDER_SITE_OTHER): Payer: Medicare Other | Admitting: Orthopaedic Surgery

## 2015-12-05 ENCOUNTER — Encounter: Payer: Self-pay | Admitting: Orthopaedic Surgery

## 2015-12-05 VITALS — BP 137/83 | HR 94 | Temp 98.1°F | Ht 66.0 in | Wt 166.0 lb

## 2015-12-05 DIAGNOSIS — M79604 Pain in right leg: Secondary | ICD-10-CM
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
## 2015-12-27 ENCOUNTER — Telehealth: Payer: Self-pay | Admitting: Orthopaedic Surgery

## 2015-12-27 NOTE — Telephone Encounter (Signed)
Patient requests a refill on Hydrocodone/Acetaminophen 5-325 mgs.  Qty  90   Sig: Take 1 tablet by mouth every 6 (six) hours as needed for moderate pain (Must last 30 days.Do not take and drive a car or use machinery.).

## 2015-12-28 MED ORDER — HYDROCODONE-ACETAMINOPHEN 5-325 MG PO TABS: 1.0000 | ORAL_TABLET | Freq: Four times a day (QID) | ORAL | 0 refills | Status: DC | PRN

## 2016-01-26 ENCOUNTER — Telehealth: Payer: Self-pay | Admitting: Orthopaedic Surgery

## 2016-01-26 NOTE — Telephone Encounter (Signed)
Patient requests refill on Hydrocodone/Acetaminophen 5-325  Mgs.   Qty  90 ° °Sig: Take 1 tablet by mouth every 6 (six) hours as needed for moderate pain (Must last 30 days.  Do not take and drive a car or use machinery.). °

## 2016-01-29 MED ORDER — HYDROCODONE-ACETAMINOPHEN 5-325 MG PO TABS: 1.0000 | ORAL_TABLET | Freq: Four times a day (QID) | ORAL | 0 refills | Status: AC | PRN

## 2016-01-31 ENCOUNTER — Emergency Department (HOSPITAL_COMMUNITY): Payer: Medicare Other

## 2016-01-31 ENCOUNTER — Emergency Department (HOSPITAL_COMMUNITY): Payer: Medicare Other | Admitting: Certified Registered Nurse Anesthetist

## 2016-01-31 ENCOUNTER — Encounter (HOSPITAL_COMMUNITY): Admission: EM | Disposition: E | Payer: Self-pay | Source: Home / Self Care | Attending: Emergency Medicine

## 2016-01-31 ENCOUNTER — Encounter (HOSPITAL_COMMUNITY): Payer: Self-pay | Admitting: Neurology

## 2016-01-31 ENCOUNTER — Emergency Department (HOSPITAL_COMMUNITY)
Admission: EM | Admit: 2016-01-31 | Discharge: 2016-03-01 | Disposition: E | Payer: Medicare Other | Attending: Emergency Medicine | Admitting: Emergency Medicine

## 2016-01-31 DIAGNOSIS — J939 Pneumothorax, unspecified: Secondary | ICD-10-CM

## 2016-01-31 DIAGNOSIS — R0489 Hemorrhage from other sites in respiratory passages: Secondary | ICD-10-CM

## 2016-01-31 DIAGNOSIS — R579 Shock, unspecified: Secondary | ICD-10-CM | POA: Insufficient documentation

## 2016-01-31 DIAGNOSIS — R0603 Acute respiratory distress: Secondary | ICD-10-CM | POA: Diagnosis present

## 2016-01-31 DIAGNOSIS — S36438A Laceration of other part of small intestine, initial encounter: Secondary | ICD-10-CM | POA: Insufficient documentation

## 2016-01-31 DIAGNOSIS — N179 Acute kidney failure, unspecified: Secondary | ICD-10-CM | POA: Diagnosis not present

## 2016-01-31 DIAGNOSIS — S270XXA Traumatic pneumothorax, initial encounter: Secondary | ICD-10-CM | POA: Diagnosis not present

## 2016-01-31 DIAGNOSIS — S066X0A Traumatic subarachnoid hemorrhage without loss of consciousness, initial encounter: Secondary | ICD-10-CM | POA: Diagnosis not present

## 2016-01-31 DIAGNOSIS — Z7982 Long term (current) use of aspirin: Secondary | ICD-10-CM | POA: Insufficient documentation

## 2016-01-31 DIAGNOSIS — S2243XA Multiple fractures of ribs, bilateral, initial encounter for closed fracture: Secondary | ICD-10-CM | POA: Insufficient documentation

## 2016-01-31 DIAGNOSIS — Y92410 Unspecified street and highway as the place of occurrence of the external cause: Secondary | ICD-10-CM | POA: Insufficient documentation

## 2016-01-31 DIAGNOSIS — S27331A Laceration of lung, unilateral, initial encounter: Secondary | ICD-10-CM | POA: Insufficient documentation

## 2016-01-31 DIAGNOSIS — Z79899 Other long term (current) drug therapy: Secondary | ICD-10-CM | POA: Insufficient documentation

## 2016-01-31 DIAGNOSIS — J942 Hemothorax: Secondary | ICD-10-CM

## 2016-01-31 DIAGNOSIS — Z419 Encounter for procedure for purposes other than remedying health state, unspecified: Secondary | ICD-10-CM

## 2016-01-31 HISTORY — PX: LAPAROTOMY: SHX154

## 2016-01-31 HISTORY — PX: CHEST EXPLORATION: SHX5104

## 2016-01-31 LAB — COMPREHENSIVE METABOLIC PANEL
ALT: 197 U/L — ABNORMAL HIGH (ref 17–63)
ANION GAP: 15 (ref 5–15)
AST: 493 U/L — AB (ref 15–41)
Albumin: 3 g/dL — ABNORMAL LOW (ref 3.5–5.0)
Alkaline Phosphatase: 84 U/L (ref 38–126)
BILIRUBIN TOTAL: 1.2 mg/dL (ref 0.3–1.2)
BUN: 6 mg/dL (ref 6–20)
CO2: 17 mmol/L — ABNORMAL LOW (ref 22–32)
Calcium: 8.4 mg/dL — ABNORMAL LOW (ref 8.9–10.3)
Chloride: 108 mmol/L (ref 101–111)
Creatinine, Ser: 0.88 mg/dL (ref 0.61–1.24)
GFR calc Af Amer: >60 mL/min (ref >60)
GFR calc non Af Amer: >60 mL/min (ref >60)
Glucose, Bld: 212 mg/dL — ABNORMAL HIGH (ref 65–99)
POTASSIUM: 3.4 mmol/L — AB (ref 3.5–5.1)
Sodium: 140 mmol/L (ref 135–145)
TOTAL PROTEIN: 5.3 g/dL — AB (ref 6.5–8.1)

## 2016-01-31 LAB — I-STAT CHEM 8, ED
BUN: 8 mg/dL (ref 6–20)
CALCIUM ION: 1.07 mmol/L — AB (ref 1.15–1.40)
CREATININE: 0.9 mg/dL (ref 0.61–1.24)
Chloride: 105 mmol/L (ref 101–111)
GLUCOSE: 197 mg/dL — AB (ref 65–99)
HCT: 45 % (ref 39.0–52.0)
HEMOGLOBIN: 15.3 g/dL (ref 13.0–17.0)
Potassium: 3.3 mmol/L — ABNORMAL LOW (ref 3.5–5.1)
Sodium: 142 mmol/L (ref 135–145)
TCO2: 22 mmol/L (ref 0–100)

## 2016-01-31 LAB — CBC
HEMATOCRIT: 43.4 % (ref 39.0–52.0)
Hemoglobin: 14.6 g/dL (ref 13.0–17.0)
MCH: 34 pg (ref 26.0–34.0)
MCHC: 33.6 g/dL (ref 30.0–36.0)
MCV: 100.9 fL — ABNORMAL HIGH (ref 78.0–100.0)
PLATELETS: 188 10*3/uL (ref 150–400)
RBC: 4.3 MIL/uL (ref 4.22–5.81)
RDW: 13.8 % (ref 11.5–15.5)
WBC: 9.2 10*3/uL (ref 4.0–10.5)

## 2016-01-31 LAB — ETHANOL: Alcohol, Ethyl (B): 161 mg/dL — ABNORMAL HIGH (ref <5)

## 2016-01-31 LAB — I-STAT CG4 LACTIC ACID, ED: LACTIC ACID, VENOUS: 6.1 mmol/L — AB (ref 0.5–1.9)

## 2016-01-31 LAB — CDS SEROLOGY

## 2016-01-31 LAB — PROTIME-INR
INR: 1.2
PROTHROMBIN TIME: 15.2 s (ref 11.4–15.2)

## 2016-01-31 LAB — ABO/RH: ABO/RH(D): O POS

## 2016-01-31 SURGERY — LAPAROTOMY, EXPLORATORY
Anesthesia: General

## 2016-01-31 MED ORDER — PROPOFOL 1000 MG/100ML IV EMUL
INTRAVENOUS | Status: AC
  Filled 2016-01-31: qty 100

## 2016-01-31 MED ORDER — FENTANYL CITRATE (PF) 100 MCG/2ML IJ SOLN
INTRAMUSCULAR | Status: DC | PRN
  Administered 2016-01-31: 50 ug via INTRAVENOUS
  Administered 2016-01-31: 100 ug via INTRAVENOUS
  Administered 2016-01-31: 50 ug via INTRAVENOUS

## 2016-01-31 MED ORDER — PROPOFOL 10 MG/ML IV BOLUS
INTRAVENOUS | Status: AC
  Filled 2016-01-31: qty 20

## 2016-01-31 MED ORDER — NALOXONE HCL 2 MG/2ML IJ SOSY
PREFILLED_SYRINGE | INTRAMUSCULAR | Status: AC
  Filled 2016-01-31: qty 2

## 2016-01-31 MED ORDER — SODIUM CHLORIDE 0.9 % IV SOLN
INTRAVENOUS | Status: DC | PRN
  Administered 2016-01-31 (×5): via INTRAVENOUS

## 2016-01-31 MED ORDER — ALBUMIN HUMAN 5 % IV SOLN
INTRAVENOUS | Status: DC | PRN
  Administered 2016-01-31 (×5): via INTRAVENOUS

## 2016-01-31 MED ORDER — VASOPRESSIN 20 UNIT/ML IV SOLN
0.0300 [IU]/min | INTRAVENOUS | Status: DC
  Filled 2016-01-31: qty 2

## 2016-01-31 MED ORDER — FENTANYL CITRATE (PF) 100 MCG/2ML IJ SOLN
INTRAMUSCULAR | Status: AC | PRN
  Administered 2016-01-31 (×2): 100 ug via INTRAVENOUS

## 2016-01-31 MED ORDER — FENTANYL CITRATE (PF) 100 MCG/2ML IJ SOLN
INTRAMUSCULAR | Status: AC
  Filled 2016-01-31: qty 2

## 2016-01-31 MED ORDER — MIDAZOLAM HCL 2 MG/2ML IJ SOLN
INTRAMUSCULAR | Status: AC
  Filled 2016-01-31: qty 2

## 2016-01-31 MED ORDER — 0.9 % SODIUM CHLORIDE (POUR BTL) OPTIME
TOPICAL | Status: DC | PRN
  Administered 2016-01-31 (×2): 2000 mL
  Administered 2016-01-31: 1000 mL

## 2016-01-31 MED ORDER — PROPOFOL 1000 MG/100ML IV EMUL
5.0000 ug/kg/min | Freq: Once | INTRAVENOUS | Status: DC
  Administered 2016-01-31: 10 ug/kg/min via INTRAVENOUS

## 2016-01-31 MED ORDER — CEFAZOLIN SODIUM 1 G IJ SOLR
INTRAMUSCULAR | Status: AC
  Filled 2016-01-31: qty 20

## 2016-01-31 MED ORDER — PROPOFOL 1000 MG/100ML IV EMUL: 5.0000 ug/kg/min | Freq: Once | INTRAVENOUS | Status: DC

## 2016-01-31 MED ORDER — CALCIUM CHLORIDE 10 % IV SOLN
INTRAVENOUS | Status: DC | PRN
  Administered 2016-01-31: 1 g via INTRAVENOUS
  Administered 2016-01-31: .2 g via INTRAVENOUS
  Administered 2016-01-31: 1 g via INTRAVENOUS
  Administered 2016-01-31: 3 g via INTRAVENOUS

## 2016-01-31 MED ORDER — PHENYLEPHRINE HCL 10 MG/ML IJ SOLN
INTRAMUSCULAR | Status: DC | PRN
  Administered 2016-01-31: 120 ug via INTRAVENOUS
  Administered 2016-01-31 (×2): 80 ug via INTRAVENOUS

## 2016-01-31 MED ORDER — SODIUM BICARBONATE 8.4 % IV SOLN
INTRAVENOUS | Status: DC | PRN
  Administered 2016-01-31 (×2): 100 meq via INTRAVENOUS
  Administered 2016-01-31: 150 meq via INTRAVENOUS

## 2016-01-31 MED ORDER — FENTANYL CITRATE (PF) 100 MCG/2ML IJ SOLN
INTRAMUSCULAR | Status: AC
Start: 2016-01-31 — End: 2016-01-31
  Filled 2016-01-31: qty 4

## 2016-01-31 MED ORDER — NALOXONE HCL 2 MG/2ML IJ SOSY
PREFILLED_SYRINGE | INTRAMUSCULAR | Status: AC | PRN
  Administered 2016-01-31: 1 mg via INTRAVENOUS

## 2016-01-31 MED ORDER — FENTANYL BOLUS VIA INFUSION
25.0000 ug | INTRAVENOUS | Status: DC | PRN
  Filled 2016-01-31: qty 25

## 2016-01-31 MED ORDER — IOPAMIDOL (ISOVUE-300) INJECTION 61%
100.0000 mL | Freq: Once | INTRAVENOUS | Status: AC | PRN
  Administered 2016-01-31: 100 mL via INTRAVENOUS

## 2016-01-31 MED ORDER — FENTANYL 2500MCG IN NS 250ML (10MCG/ML) PREMIX INFUSION
25.0000 ug/h | INTRAVENOUS | Status: DC
  Filled 2016-01-31: qty 250

## 2016-01-31 MED ORDER — CEFAZOLIN SODIUM-DEXTROSE 2-3 GM-% IV SOLR
INTRAVENOUS | Status: DC | PRN
  Administered 2016-01-31: 2 g via INTRAVENOUS

## 2016-01-31 MED ORDER — MIDAZOLAM HCL 5 MG/5ML IJ SOLN
INTRAMUSCULAR | Status: DC | PRN
  Administered 2016-01-31: 2 mg via INTRAVENOUS

## 2016-01-31 MED ORDER — SODIUM CHLORIDE 0.9 % IV BOLUS (SEPSIS)
1000.0000 mL | Freq: Once | INTRAVENOUS | Status: AC
  Administered 2016-01-31: 1000 mL via INTRAVENOUS

## 2016-01-31 MED ORDER — POVIDONE-IODINE 10 % EX OINT
TOPICAL_OINTMENT | CUTANEOUS | Status: AC
  Filled 2016-01-31: qty 28.35

## 2016-01-31 MED ORDER — ETOMIDATE 2 MG/ML IV SOLN
INTRAVENOUS | Status: AC | PRN
  Administered 2016-01-31: 20 mg via INTRAVENOUS

## 2016-01-31 MED ORDER — SUCCINYLCHOLINE CHLORIDE 20 MG/ML IJ SOLN
INTRAMUSCULAR | Status: AC | PRN
  Administered 2016-01-31: 100 mg via INTRAVENOUS

## 2016-01-31 MED ORDER — ROCURONIUM BROMIDE 100 MG/10ML IV SOLN
INTRAVENOUS | Status: DC | PRN
  Administered 2016-01-31 (×2): 50 mg via INTRAVENOUS

## 2016-01-31 MED ORDER — SODIUM CHLORIDE 0.9 % IV SOLN
0.0000 mg/h | INTRAVENOUS | Status: DC
  Administered 2016-01-31: 2 mg/h via INTRAVENOUS
  Filled 2016-01-31: qty 10

## 2016-01-31 MED ORDER — DEXTROSE 5 % IV SOLN
INTRAVENOUS | Status: DC | PRN
  Administered 2016-01-31: .03 [IU]/min via INTRAVENOUS

## 2016-01-31 MED ORDER — MIDAZOLAM HCL 5 MG/5ML IJ SOLN
INTRAMUSCULAR | Status: AC | PRN
  Administered 2016-01-31 (×2): 2 mg via INTRAVENOUS

## 2016-01-31 MED ORDER — EPINEPHRINE PF 1 MG/10ML IJ SOSY
PREFILLED_SYRINGE | INTRAMUSCULAR | Status: DC | PRN
  Administered 2016-01-31: 100 ug via INTRAVENOUS
  Administered 2016-01-31: 600 ug via INTRAVENOUS
  Administered 2016-01-31 (×4): 100 ug via INTRAVENOUS

## 2016-01-31 MED ORDER — MIDAZOLAM HCL 2 MG/2ML IJ SOLN
INTRAMUSCULAR | Status: AC
  Filled 2016-01-31: qty 4

## 2016-01-31 MED ORDER — LACTATED RINGERS IV SOLN
INTRAVENOUS | Status: DC | PRN
  Administered 2016-01-31: 13:00:00 via INTRAVENOUS

## 2016-01-31 SURGICAL SUPPLY — 67 items
BLADE SURG ROTATE 9660 (MISCELLANEOUS) ×4 IMPLANT
CANISTER SUCTION 2500CC (MISCELLANEOUS) ×4 IMPLANT
CONT SPEC 4OZ CLIKSEAL STRL BL (MISCELLANEOUS) ×4 IMPLANT
COVER SURGICAL LIGHT HANDLE (MISCELLANEOUS) ×4 IMPLANT
DRAPE CHEST BREAST 15X10 FENES (DRAPES) ×4 IMPLANT
DRAPE LAPAROSCOPIC ABDOMINAL (DRAPES) ×4 IMPLANT
DRAPE ORTHO SPLIT 77X108 STRL (DRAPES) ×2
DRAPE SURG ORHT 6 SPLT 77X108 (DRAPES) ×2 IMPLANT
DRAPE WARM FLUID 44X44 (DRAPE) ×4 IMPLANT
DRSG PAD ABDOMINAL 8X10 ST (GAUZE/BANDAGES/DRESSINGS) ×4 IMPLANT
ELECT BLADE 6.5 EXT (BLADE) ×4 IMPLANT
ELECT CAUTERY BLADE 6.4 (BLADE) ×4 IMPLANT
ELECT REM PT RETURN 9FT ADLT (ELECTROSURGICAL) ×4
ELECTRODE REM PT RTRN 9FT ADLT (ELECTROSURGICAL) ×2 IMPLANT
FELT TEFLON 1X6 (MISCELLANEOUS) ×4 IMPLANT
GAUZE SPONGE 4X4 12PLY STRL (GAUZE/BANDAGES/DRESSINGS) ×4 IMPLANT
GLOVE BIO SURGEON STRL SZ 6.5 (GLOVE) ×6 IMPLANT
GLOVE BIO SURGEON STRL SZ7.5 (GLOVE) ×4 IMPLANT
GLOVE BIO SURGEONS STRL SZ 6.5 (GLOVE) ×2
GLOVE BIOGEL PI IND STRL 6.5 (GLOVE) ×10 IMPLANT
GLOVE BIOGEL PI IND STRL 8 (GLOVE) ×2 IMPLANT
GLOVE BIOGEL PI INDICATOR 6.5 (GLOVE) ×10
GLOVE BIOGEL PI INDICATOR 8 (GLOVE) ×2
GLOVE ECLIPSE 7.5 STRL STRAW (GLOVE) ×4 IMPLANT
GLOVE SURG SS PI 6.5 STRL IVOR (GLOVE) ×20 IMPLANT
GOWN STRL REUS W/ TWL LRG LVL3 (GOWN DISPOSABLE) ×8 IMPLANT
GOWN STRL REUS W/TWL LRG LVL3 (GOWN DISPOSABLE) ×8
HANDLE SUCTION POOLE (INSTRUMENTS) ×2 IMPLANT
KIT BASIN OR (CUSTOM PROCEDURE TRAY) ×4 IMPLANT
KIT ROOM TURNOVER OR (KITS) ×4 IMPLANT
LIGASURE IMPACT 36 18CM CVD LR (INSTRUMENTS) ×4 IMPLANT
NS IRRIG 1000ML POUR BTL (IV SOLUTION) ×8 IMPLANT
PACK GENERAL/GYN (CUSTOM PROCEDURE TRAY) ×4 IMPLANT
PAD ARMBOARD 7.5X6 YLW CONV (MISCELLANEOUS) ×8 IMPLANT
RELOAD GOLD ECHELON 45 (STAPLE) ×48 IMPLANT
RELOAD PROXIMATE 75MM BLUE (ENDOMECHANICALS) ×12 IMPLANT
SEPRAFILM PROCEDURAL PACK 3X5 (MISCELLANEOUS) ×4 IMPLANT
SPONGE LAP 18X18 X RAY DECT (DISPOSABLE) ×40 IMPLANT
STAPLER GUN LINEAR PROX 60 (STAPLE) ×4 IMPLANT
STAPLER PROXIMATE 75MM BLUE (STAPLE) ×4 IMPLANT
STAPLER VISISTAT 35W (STAPLE) ×4 IMPLANT
SUCTION POOLE HANDLE (INSTRUMENTS) ×4
SUT ETHILON 1 LR 30 (SUTURE) ×4 IMPLANT
SUT ETHILON 2 LR (SUTURE) ×4 IMPLANT
SUT PDS AB 1 TP1 96 (SUTURE) ×8 IMPLANT
SUT PROLENE 4 0 RB 1 (SUTURE) ×4
SUT PROLENE 4-0 RB1 .5 CRCL 36 (SUTURE) ×4 IMPLANT
SUT SILK 0 FSL (SUTURE) ×4 IMPLANT
SUT SILK 2 0 SH CR/8 (SUTURE) ×8 IMPLANT
SUT SILK 2 0 TIES 10X30 (SUTURE) ×4 IMPLANT
SUT SILK 3 0 SH CR/8 (SUTURE) ×8 IMPLANT
SUT SILK 3 0 TIES 10X30 (SUTURE) ×4 IMPLANT
SUT VIC AB 0 CT1 27 (SUTURE) ×4
SUT VIC AB 0 CT1 27XBRD ANBCTR (SUTURE) ×4 IMPLANT
SUT VIC AB 1 CT1 18XCR BRD 8 (SUTURE) ×2 IMPLANT
SUT VIC AB 1 CT1 8-18 (SUTURE) ×2
SUT VIC AB 1 CTX 18 (SUTURE) ×4 IMPLANT
SUT VIC AB 2-0 CTX 36 (SUTURE) ×8 IMPLANT
SUT VIC AB 3-0 X1 27 (SUTURE) ×8 IMPLANT
SUT VICRYL 2 TP 1 (SUTURE) ×8 IMPLANT
SYSTEM SAHARA CHEST DRAIN ATS (WOUND CARE) ×4 IMPLANT
TAPE CLOTH SURG 4X10 WHT LF (GAUZE/BANDAGES/DRESSINGS) ×4 IMPLANT
TOWEL OR 17X26 10 PK STRL BLUE (TOWEL DISPOSABLE) ×4 IMPLANT
TRAY FOLEY CATH 16FRSI W/METER (SET/KITS/TRAYS/PACK) ×4 IMPLANT
TUBE CONNECTING 12'X1/4 (SUCTIONS) ×1
TUBE CONNECTING 12X1/4 (SUCTIONS) ×3 IMPLANT
YANKAUER SUCT BULB TIP NO VENT (SUCTIONS) ×8 IMPLANT

## 2016-02-01 ENCOUNTER — Encounter (HOSPITAL_COMMUNITY): Payer: Self-pay | Admitting: General Surgery

## 2016-02-01 LAB — TYPE AND SCREEN
ABO/RH(D): O POS
ANTIBODY SCREEN: NEGATIVE
UNIT DIVISION: 0
UNIT DIVISION: 0
UNIT DIVISION: 0
UNIT DIVISION: 0
UNIT DIVISION: 0
UNIT DIVISION: 0
UNIT DIVISION: 0
UNIT DIVISION: 0
UNIT DIVISION: 0
UNIT DIVISION: 0
UNIT DIVISION: 0
UNIT DIVISION: 0
UNIT DIVISION: 0
UNIT DIVISION: 0
UNIT DIVISION: 0
UNIT DIVISION: 0
UNIT DIVISION: 0
UNIT DIVISION: 0
UNIT DIVISION: 0
Unit division: 0
Unit division: 0
Unit division: 0
Unit division: 0
Unit division: 0
Unit division: 0
Unit division: 0
Unit division: 0
Unit division: 0
Unit division: 0
Unit division: 0
Unit division: 0
Unit division: 0
Unit division: 0
Unit division: 0
Unit division: 0
Unit division: 0
Unit division: 0
Unit division: 0
Unit division: 0
Unit division: 0
Unit division: 0
Unit division: 0
Unit division: 0
Unit division: 0

## 2016-02-01 LAB — PREPARE FRESH FROZEN PLASMA
UNIT DIVISION: 0
UNIT DIVISION: 0
UNIT DIVISION: 0
UNIT DIVISION: 0
UNIT DIVISION: 0
UNIT DIVISION: 0
UNIT DIVISION: 0
UNIT DIVISION: 0
UNIT DIVISION: 0
UNIT DIVISION: 0
UNIT DIVISION: 0
UNIT DIVISION: 0
UNIT DIVISION: 0
UNIT DIVISION: 0
Unit division: 0
Unit division: 0
Unit division: 0
Unit division: 0
Unit division: 0
Unit division: 0
Unit division: 0
Unit division: 0
Unit division: 0
Unit division: 0
Unit division: 0

## 2016-02-01 LAB — PREPARE CRYOPRECIPITATE
Unit division: 0
Unit division: 0

## 2016-02-01 LAB — PREPARE PLATELET PHERESIS
UNIT DIVISION: 0
UNIT DIVISION: 0
UNIT DIVISION: 0

## 2016-02-01 LAB — POCT I-STAT 7, (LYTES, BLD GAS, ICA,H+H)
Acid-base deficit: 18 mmol/L — ABNORMAL HIGH (ref 0.0–2.0)
Bicarbonate: 14.7 mmol/L — ABNORMAL LOW (ref 20.0–28.0)
Calcium, Ion: <0.3 mmol/L — CL (ref 1.15–1.40)
HEMATOCRIT: 23 % — AB (ref 39.0–52.0)
HEMOGLOBIN: 7.8 g/dL — AB (ref 13.0–17.0)
O2 SAT: 60 %
PCO2 ART: 74.7 mmHg — AB (ref 32.0–48.0)
PH ART: 6.892 — AB (ref 7.350–7.450)
POTASSIUM: 5.3 mmol/L — AB (ref 3.5–5.1)
SODIUM: 149 mmol/L — AB (ref 135–145)
TCO2: 17 mmol/L (ref 0–100)
pO2, Arterial: 49 mmHg — ABNORMAL LOW (ref 83.0–108.0)

## 2016-02-02 LAB — POCT I-STAT 7, (LYTES, BLD GAS, ICA,H+H)
ACID-BASE DEFICIT: 12 mmol/L — AB (ref 0.0–2.0)
ACID-BASE DEFICIT: 10 mmol/L — AB (ref 0.0–2.0)
BICARBONATE: 19.5 mmol/L — AB (ref 20.0–28.0)
Bicarbonate: 18.4 mmol/L — ABNORMAL LOW (ref 20.0–28.0)
CALCIUM ION: 1.06 mmol/L — AB (ref 1.15–1.40)
Calcium, Ion: 1 mmol/L — ABNORMAL LOW (ref 1.15–1.40)
HCT: 33 % — ABNORMAL LOW (ref 39.0–52.0)
HCT: 37 % — ABNORMAL LOW (ref 39.0–52.0)
HEMOGLOBIN: 11.2 g/dL — AB (ref 13.0–17.0)
Hemoglobin: 12.6 g/dL — ABNORMAL LOW (ref 13.0–17.0)
O2 SAT: 100 %
O2 SAT: 100 %
PCO2 ART: 60.3 mmHg — AB (ref 32.0–48.0)
PCO2 ART: 51.2 mmHg — AB (ref 32.0–48.0)
PH ART: 7.078 — AB (ref 7.350–7.450)
PH ART: 7.174 — AB (ref 7.350–7.450)
PO2 ART: 419 mmHg — AB (ref 83.0–108.0)
POTASSIUM: 3.7 mmol/L (ref 3.5–5.1)
Potassium: 3.6 mmol/L (ref 3.5–5.1)
Sodium: 145 mmol/L (ref 135–145)
Sodium: 147 mmol/L — ABNORMAL HIGH (ref 135–145)
TCO2: 20 mmol/L (ref 0–100)
TCO2: 21 mmol/L (ref 0–100)
pO2, Arterial: 475 mmHg — ABNORMAL HIGH (ref 83.0–108.0)

## 2016-02-02 MED FILL — Sodium Chloride Irrigation Soln 0.9%: Qty: 1000 | Status: AC

## 2016-02-02 MED FILL — Heparin Sodium (Porcine) Inj 1000 Unit/ML: INTRAMUSCULAR | Qty: 30 | Status: AC

## 2016-02-02 MED FILL — Sodium Chloride IV Soln 0.9%: INTRAVENOUS | Qty: 2000 | Status: AC

## 2016-02-02 MED FILL — Sodium Chloride IV Soln 0.9%: INTRAVENOUS | Qty: 1000 | Status: AC

## 2016-02-05 ENCOUNTER — Encounter: Payer: Self-pay | Admitting: Orthopaedic Surgery

## 2016-03-01 NOTE — ED Notes (Signed)
Dr. Lindie SpruceWyatt reports pt will be going to the OR from here.

## 2016-03-01 NOTE — ED Notes (Signed)
2nd unit of emergency release blood has gone in (335 cc).
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
## 2016-03-01 NOTE — ED Notes (Signed)
Emergency release blood stopped, pt received 150 cc blood.

## 2016-03-01 NOTE — ED Notes (Addendum)
1st unit RBC finished. 335 IN

## 2016-03-01 NOTE — Procedures (Signed)
Chest Tube Insertion Procedure Note  Indications:  Clinically significant Pneumothorax  Pre-operative Diagnosis: Pneumothorax  Post-operative Diagnosis: Pneumothorax  Procedure Details  Procedure was done as emergency.  After sterile skin prep, using standard technique, a 24 French tube was placed in the left lateral 4th rib space.  Findings: Rush of air   Estimated Blood Loss:  Minimal         Specimens:  None              Complications:  None; patient tolerated the procedure well.         Disposition: ED         Condition: unstable     Freeman CaldronMichael J. Caasi Giglia, PA-C Pager: [phone] General Trauma PA Pager: [phone]

## 2016-03-01 NOTE — ED Notes (Addendum)
#   2 FFP started 275 volume. Emergency release, Dr. Lindie SpruceWyatt. [id number]
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
## 2016-03-01 NOTE — ED Notes (Signed)
Planning to intubate patient.

## 2016-03-01 NOTE — ED Notes (Signed)
RBC # 2 UNIT started [id number]. Emergency Release by Dr. Lindie SpruceWyatt. Volume 335

## 2016-03-01 NOTE — Anesthesia Preprocedure Evaluation (Signed)
Anesthesia Evaluation  Patient identified by MRN, date of birth, ID band Patient unresponsive    Reviewed: Patient's Chart, lab work & pertinent test results, Unable to perform ROS - Chart review only  Airway Mallampati: Intubated       Dental   Pulmonary    + rhonchi  + decreased breath sounds      Cardiovascular  Rhythm:Regular Rate:Tachycardia     Neuro/Psych    GI/Hepatic   Endo/Other    Renal/GU      Musculoskeletal   Abdominal   Peds  Hematology   Anesthesia Other Findings   Reproductive/Obstetrics                             Anesthesia Physical Anesthesia Plan  ASA: IV and emergent  Anesthesia Plan: General   Post-op Pain Management:    Induction: Intravenous  Airway Management Planned: Oral ETT  Additional Equipment: Arterial line and CVP  Intra-op Plan:   Post-operative Plan: Post-operative intubation/ventilation  Informed Consent:   Plan Discussed with: CRNA and Anesthesiologist  Anesthesia Plan Comments:         Anesthesia Quick Evaluation

## 2016-03-01 NOTE — Procedures (Signed)
Central Venous Catheter Insertion Procedure Note Kent Morrow [id number] 07/28/1945  Procedure: Insertion of Central Venous Catheter Indications: Drug and/or fluid administration  Procedure Details Consent: Unable to obtain consent because of emergent medical necessity. Time Out: Verified patient identification, verified procedure, site/side was marked, verified correct patient position, special equipment/implants available, medications/allergies/relevent history reviewed, required imaging and test results available.  Performed  Maximum sterile technique was used including cap, gloves, mask and sheet. Skin prep: Iodine solution; local anesthetic administered A antimicrobial bonded/coated triple lumen large bore catheter was placed in the left subclavian vein using the Seldinger technique.  Evaluation Blood flow good Complications: No apparent complications Patient did tolerate procedure well. Chest X-ray ordered to verify placement.  CXR: normal.  Kent Morrow 02/19/2016, 3:17 PM

## 2016-03-01 NOTE — ED Notes (Signed)
Pt logrolled, normal exam, normal rectal tone.

## 2016-03-01 NOTE — ED Notes (Signed)
Emergency release blood has been re-started, is hypotensive at this time.
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
## 2016-03-01 NOTE — ED Notes (Addendum)
1 unit of blood has finished infusing (335 cc).

## 2016-03-01 NOTE — ED Notes (Addendum)
1st unit RBC started, emergency release by Dr. Lindie SpruceWyatt, [id number]. 335 VOLUME

## 2016-03-01 NOTE — Progress Notes (Signed)
RT note: Transported patient to OR from ED.

## 2016-03-01 NOTE — Anesthesia Postprocedure Evaluation (Signed)
Anesthesia Post Note  Patient: Kent Morrow  Procedure(s) Performed: Procedure(s) (LRB): EXPLORATORY LAPAROTOMY (N/A)  Anesthesia Post Evaluation  Last Vitals:  Vitals:   28-Jan-2016 1137 28-Jan-2016 1210  BP:  136/93  Pulse: 109 108  Resp:    Temp:      Last Pain:  Vitals:   28-Jan-2016 1100  TempSrc: Axillary                 Esaw Knippel
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
## 2016-03-01 NOTE — Transfer of Care (Signed)
Immediate Anesthesia Transfer of Care Note  Patient: Kent Morrow  Procedure(s) Performed: Procedure(s): EXPLORATORY LAPAROTOMY (N/A)  Time of death 1515

## 2016-03-01 NOTE — ED Notes (Signed)
Repeat FAST exam negative by Dr. Lindie SpruceWyatt.

## 2016-03-01 NOTE — Procedures (Signed)
Chest Tube Insertion Procedure Note  Indications:  Clinically significant Pneumothorax and Hemothorax  Pre-operative Diagnosis: Pneumothorax and Hemothorax  Post-operative Diagnosis: Pneumothorax and Hemothorax  Procedure Details  Informed consent was obtained for the procedure, including sedation.  Risks of lung perforation, hemorrhage, arrhythmia, and adverse drug reaction were discussed.   After sterile skin prep, using standard technique, a 32 French tube was placed in the right lateral 6th rib space.  Findings: 200 ml of bloody fluid obtained  Estimated Blood Loss:  200 mL         Specimens:  None              Complications:  None; patient tolerated the procedure well.         Disposition: Critically ill, had to go urgently to the ICU after OR         Condition: unstable  Attending Attestation: I performed the procedure.

## 2016-03-01 NOTE — ED Notes (Signed)
Dr. Lindie SpruceWyatt placing right sided chest tube.
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
## 2016-03-01 NOTE — ED Notes (Addendum)
FAST NEGATIVE BY DR. Lindie SpruceWYATT.

## 2016-03-01 NOTE — ED Notes (Signed)
FFP # 1 infusing [id number]. Volume 252. Emergency Release by Dr.Wyatt.
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
## 2016-03-01 NOTE — ED Notes (Addendum)
Starting rapid infuser Level 1 2 units blood, Nikki, RN/Linda, RCharity fundraiser

## 2016-03-01 DEATH — deceased

## 2016-03-05 ENCOUNTER — Ambulatory Visit: Payer: Medicare Other | Admitting: Orthopaedic Surgery
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
